# Patient Record
Sex: Female | Born: 1935 | Race: White | Hispanic: No | Marital: Married | Smoking: Never smoker
Health system: Southern US, Community
[De-identification: ages and names within clinical notes are randomized; demographics above are authoritative.]

## PROBLEM LIST (undated history)

## (undated) DIAGNOSIS — C801 Malignant (primary) neoplasm, unspecified: Secondary | ICD-10-CM

## (undated) DIAGNOSIS — D132 Benign neoplasm of duodenum: Secondary | ICD-10-CM

## (undated) DIAGNOSIS — K469 Unspecified abdominal hernia without obstruction or gangrene: Secondary | ICD-10-CM

## (undated) DIAGNOSIS — K635 Polyp of colon: Secondary | ICD-10-CM

## (undated) DIAGNOSIS — J984 Other disorders of lung: Secondary | ICD-10-CM

## (undated) DIAGNOSIS — D649 Anemia, unspecified: Secondary | ICD-10-CM

## (undated) HISTORY — PX: VEIN SURGERY: SHX48

## (undated) HISTORY — DX: Malignant (primary) neoplasm, unspecified: C80.1

## (undated) HISTORY — PX: OTHER SURGICAL HISTORY: SHX169

## (undated) HISTORY — DX: Benign neoplasm of duodenum: D13.2

## (undated) HISTORY — DX: Polyp of colon: K63.5

## (undated) HISTORY — PX: EXPLORATORY LAPAROTOMY: SUR591

## (undated) HISTORY — DX: Anemia, unspecified: D64.9

## (undated) HISTORY — DX: Unspecified abdominal hernia without obstruction or gangrene: K46.9

## (undated) HISTORY — PX: BASAL CELL CARCINOMA EXCISION: SHX1214

## (undated) HISTORY — DX: Other disorders of lung: J98.4

---
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
## 1973-08-04 HISTORY — PX: ABDOMINAL HYSTERECTOMY: SHX81

## 1998-08-04 HISTORY — PX: GALLBLADDER SURGERY: SHX652

## 2005-08-21 ENCOUNTER — Encounter: Admission: RE | Admit: 2005-08-21 | Discharge: 2005-08-21 | Payer: Self-pay | Admitting: Internal Medicine

## 2005-09-04 ENCOUNTER — Encounter: Admission: RE | Admit: 2005-09-04 | Discharge: 2005-09-04 | Payer: Self-pay | Admitting: Internal Medicine

## 2005-09-11 ENCOUNTER — Encounter: Admission: RE | Admit: 2005-09-11 | Discharge: 2005-09-11 | Payer: Self-pay | Admitting: Internal Medicine

## 2005-10-02 ENCOUNTER — Encounter: Admission: RE | Admit: 2005-10-02 | Discharge: 2005-10-02 | Payer: Self-pay | Admitting: Internal Medicine

## 2006-03-05 ENCOUNTER — Encounter: Admission: RE | Admit: 2006-03-05 | Discharge: 2006-03-05 | Payer: Self-pay | Admitting: Internal Medicine

## 2007-03-03 ENCOUNTER — Ambulatory Visit: Payer: Self-pay | Admitting: Pulmonary Disease

## 2007-03-03 ENCOUNTER — Encounter: Payer: Self-pay | Admitting: Critical Care Medicine

## 2007-03-03 LAB — CONVERTED CEMR LAB: Angiotensin 1 Converting Enzyme: 73 units/L — ABNORMAL HIGH (ref 9–67)

## 2007-03-17 ENCOUNTER — Ambulatory Visit: Payer: Self-pay | Admitting: Pulmonary Disease

## 2007-03-19 ENCOUNTER — Ambulatory Visit (HOSPITAL_COMMUNITY): Admission: RE | Admit: 2007-03-19 | Discharge: 2007-03-19 | Payer: Self-pay | Admitting: Gastroenterology

## 2007-03-19 ENCOUNTER — Encounter (INDEPENDENT_AMBULATORY_CARE_PROVIDER_SITE_OTHER): Payer: Self-pay | Admitting: Gastroenterology

## 2007-05-12 ENCOUNTER — Encounter (INDEPENDENT_AMBULATORY_CARE_PROVIDER_SITE_OTHER): Payer: Self-pay | Admitting: Gastroenterology

## 2007-05-12 ENCOUNTER — Ambulatory Visit (HOSPITAL_COMMUNITY): Admission: RE | Admit: 2007-05-12 | Discharge: 2007-05-12 | Payer: Self-pay | Admitting: Gastroenterology

## 2007-08-23 ENCOUNTER — Encounter: Payer: Self-pay | Admitting: Pulmonary Disease

## 2007-09-22 DIAGNOSIS — I8000 Phlebitis and thrombophlebitis of superficial vessels of unspecified lower extremity: Secondary | ICD-10-CM | POA: Insufficient documentation

## 2007-09-22 DIAGNOSIS — R0602 Shortness of breath: Secondary | ICD-10-CM | POA: Insufficient documentation

## 2007-09-22 DIAGNOSIS — D869 Sarcoidosis, unspecified: Secondary | ICD-10-CM

## 2007-09-22 DIAGNOSIS — J984 Other disorders of lung: Secondary | ICD-10-CM

## 2007-09-22 DIAGNOSIS — C4490 Unspecified malignant neoplasm of skin, unspecified: Secondary | ICD-10-CM

## 2007-09-22 DIAGNOSIS — R599 Enlarged lymph nodes, unspecified: Secondary | ICD-10-CM | POA: Insufficient documentation

## 2007-09-22 DIAGNOSIS — R071 Chest pain on breathing: Secondary | ICD-10-CM

## 2007-09-23 ENCOUNTER — Ambulatory Visit: Payer: Self-pay | Admitting: Pulmonary Disease

## 2010-03-04 ENCOUNTER — Encounter: Admission: RE | Admit: 2010-03-04 | Discharge: 2010-03-04 | Payer: Self-pay | Admitting: General Surgery

## 2010-04-18 ENCOUNTER — Inpatient Hospital Stay (HOSPITAL_COMMUNITY): Admission: RE | Admit: 2010-04-18 | Discharge: 2010-04-26 | Payer: Self-pay | Admitting: General Surgery

## 2010-04-18 ENCOUNTER — Encounter (INDEPENDENT_AMBULATORY_CARE_PROVIDER_SITE_OTHER): Payer: Self-pay | Admitting: General Surgery

## 2010-10-17 LAB — CBC
HCT: 31.7 % — ABNORMAL LOW (ref 36.0–46.0)
HCT: 37.2 % (ref 36.0–46.0)
HCT: 38.5 % (ref 36.0–46.0)
MCH: 31.2 pg (ref 26.0–34.0)
MCH: 31.3 pg (ref 26.0–34.0)
MCH: 31.5 pg (ref 26.0–34.0)
MCH: 30.6 pg (ref 26.0–34.0)
MCHC: 34.3 g/dL (ref 30.0–36.0)
MCHC: 34.8 g/dL (ref 30.0–36.0)
MCHC: 34.8 g/dL (ref 30.0–36.0)
MCHC: 34 g/dL (ref 30.0–36.0)
MCV: 90.8 fL (ref 78.0–100.0)
MCV: 89.9 fL (ref 78.0–100.0)
Platelets: 287 10*3/uL (ref 150–400)
Platelets: 316 10*3/uL (ref 150–400)
Platelets: 268 10*3/uL (ref 150–400)
RBC: 4.02 MIL/uL (ref 3.87–5.11)
RBC: 4.1 MIL/uL (ref 3.87–5.11)
RDW: 12.9 % (ref 11.5–15.5)
RDW: 13.2 % (ref 11.5–15.5)
RDW: 13.6 % (ref 11.5–15.5)
RDW: 13.3 % (ref 11.5–15.5)
WBC: 11.3 10*3/uL — ABNORMAL HIGH (ref 4.0–10.5)
WBC: 6.3 10*3/uL (ref 4.0–10.5)
WBC: 3.4 10*3/uL — ABNORMAL LOW (ref 4.0–10.5)

## 2010-10-17 LAB — COMPREHENSIVE METABOLIC PANEL
ALT: 31 U/L (ref 0–35)
ALT: 37 U/L — ABNORMAL HIGH (ref 0–35)
ALT: 39 U/L — ABNORMAL HIGH (ref 0–35)
ALT: 16 U/L (ref 0–35)
AST: 24 U/L (ref 0–37)
AST: 33 U/L (ref 0–37)
AST: 19 U/L (ref 0–37)
Albumin: 2.4 g/dL — ABNORMAL LOW (ref 3.5–5.2)
Albumin: 2.6 g/dL — ABNORMAL LOW (ref 3.5–5.2)
Alkaline Phosphatase: 125 U/L — ABNORMAL HIGH (ref 39–117)
Alkaline Phosphatase: 137 U/L — ABNORMAL HIGH (ref 39–117)
BUN: 10 mg/dL (ref 6–23)
BUN: 8 mg/dL (ref 6–23)
CO2: 27 mEq/L (ref 19–32)
Calcium: 8.3 mg/dL — ABNORMAL LOW (ref 8.4–10.5)
Calcium: 8.7 mg/dL (ref 8.4–10.5)
GFR calc Af Amer: >60 mL/min (ref >60)
GFR calc Af Amer: >60 mL/min (ref >60)
GFR calc Af Amer: >60 mL/min (ref >60)
GFR calc non Af Amer: >60 mL/min (ref >60)
Glucose, Bld: 124 mg/dL — ABNORMAL HIGH (ref 70–99)
Potassium: 3.9 mEq/L (ref 3.5–5.1)
Sodium: 134 mEq/L — ABNORMAL LOW (ref 135–145)
Sodium: 135 mEq/L (ref 135–145)
Total Bilirubin: 1.4 mg/dL — ABNORMAL HIGH (ref 0.3–1.2)
Total Bilirubin: 0.4 mg/dL (ref 0.3–1.2)
Total Protein: 5.8 g/dL — ABNORMAL LOW (ref 6.0–8.3)
Total Protein: 6 g/dL (ref 6.0–8.3)
Total Protein: 6.4 g/dL (ref 6.0–8.3)

## 2010-10-17 LAB — ABO/RH: ABO/RH(D): B POS

## 2010-10-17 LAB — URINE MICROSCOPIC-ADD ON

## 2010-10-17 LAB — BASIC METABOLIC PANEL
BUN: 10 mg/dL (ref 6–23)
BUN: 11 mg/dL (ref 6–23)
CO2: 27 mEq/L (ref 19–32)
CO2: 29 mEq/L (ref 19–32)
Calcium: 8.5 mg/dL (ref 8.4–10.5)
Chloride: 105 mEq/L (ref 96–112)
Creatinine, Ser: 0.64 mg/dL (ref 0.4–1.2)
Creatinine, Ser: 0.71 mg/dL (ref 0.4–1.2)
Creatinine, Ser: 0.79 mg/dL (ref 0.4–1.2)
GFR calc Af Amer: >60 mL/min (ref >60)
GFR calc Af Amer: >60 mL/min (ref >60)
GFR calc non Af Amer: >60 mL/min (ref >60)
GFR calc non Af Amer: >60 mL/min (ref >60)
Glucose, Bld: 131 mg/dL — ABNORMAL HIGH (ref 70–99)
Sodium: 137 mEq/L (ref 135–145)

## 2010-10-17 LAB — URINALYSIS, ROUTINE W REFLEX MICROSCOPIC
Glucose, UA: NEGATIVE mg/dL
Hgb urine dipstick: NEGATIVE
Protein, ur: NEGATIVE mg/dL

## 2010-10-17 LAB — DIFFERENTIAL
Basophils Absolute: 0 10*3/uL (ref 0.0–0.1)
Eosinophils Absolute: 0.2 10*3/uL (ref 0.0–0.7)
Eosinophils Relative: 7 % — ABNORMAL HIGH (ref 0–5)
Lymphs Abs: 0.9 10*3/uL (ref 0.7–4.0)

## 2010-10-17 LAB — PROTIME-INR: Prothrombin Time: 13.5 seconds (ref 11.6–15.2)

## 2010-10-17 LAB — SURGICAL PCR SCREEN: Staphylococcus aureus: NEGATIVE

## 2010-10-17 LAB — CROSSMATCH
ABO/RH(D): B POS
Antibody Screen: NEGATIVE

## 2010-12-17 NOTE — Op Note (Signed)
Shelby Mendez, Shelby Mendez               ACCOUNT NO.:  [id number]   MEDICAL RECORD NO.:  [id number]          PATIENT TYPE:  AMB   LOCATION:  ENDO                         FACILITY:  [facility]   PHYSICIAN:  Anselmo Rod, M.D.  DATE OF BIRTH:  1931/11/23   DATE OF PROCEDURE:  03/19/2007  DATE OF DISCHARGE:                               OPERATIVE REPORT   PROCEDURE PERFORMED:  Esophagogastroduodenoscopy with multiple cold  biopsies.   ENDOSCOPIST:  Anselmo Rod, M.D.   INSTRUMENT USED:  Pentax video panendoscope.   INDICATIONS FOR PROCEDURE:  This is a 77 year old white female with a  history of nausea, abdominal pain, reflux and change in bowel habits  undergoing EGD to rule out peptic ulcer disease, esophagitis, gastritis,  etc.  Small bowel biopsies are planned.   PREPROCEDURE PREPARATION:  Informed consent was procured from the  patient.  The patient fasted for eight hours prior to the procedure.   PHYSICAL EXAMINATION:  VITAL SIGNS:  The patient has stable vital signs.  NECK:  The neck is supple.  CHEST:  The chest clear to auscultation.  HEART:  The heart reveals S1 and S2,  regular.  ABDOMEN:  The abdomen is soft with normal bowel sounds.   DESCRIPTION OF THE PROCEDURE:  The patient was placed in left lateral  decubitus position, sedated with 50 mcg of Fentanyl 7 mg of Versed given  intravenously in slow incremental doses. Once the patient was adequately  sedated and maintained on low-flow oxygen and continuous cardiac  monitoring the Pentax video panendoscope was advanced through the  mouthpiece, over the tongue and into the esophagus under direct vision.  The entire esophagus was widely patent with no evidence of ring,  stricture, mass, esophagitis, or Barrett's mucosa. The scope was then  advanced into the stomach. A few small sessile gastric polyps were seen  in the high cardia.  Several of these were biopsied for pathology. The  rest of the gastric mucosa appeared  somewhat featureless.  No ulcers,  erosions, masses or polyps were identified.  Retroflexion in the high  cardia revealed no evidence of a hiatal hernia.  There was a prominent  fold versus a questionable mass in the postbulbar area that was biopsied  for pathology.  Isolated diverticulum was also seen in the first portion  of the duodenum. There was no outlet obstruction. The patient tolerated  the procedure well without immediate complications.   IMPRESSION:  1. Normal-appearing esophagus with no evidence of esophagitis or      Barrett's mucosa.  2. A few small sessile polyps in the high cardia; biopsied for      pathology.  3. No ulcers, erosions, masses or polyps were identified.  4. Prominent fold in the postbulbar area; multiple biopsies done for      pathology.  5. Isolated diverticulum in the first portion of the duodenum.   RECOMMENDATIONS:  1. Await pathology results.  2. Avoid all nonsteroidals including aspirin for the next 4 weeks  3. Continue PPI's.  4. Proceed with a colonoscopy at this time.  5. Further recommendations to be  made in follow-up.      Anselmo Rod, M.D.  Electronically Signed     JNM/MEDQ  D:  03/19/2007  T:  03/20/2007  Job:  [id number]

## 2010-12-17 NOTE — Op Note (Signed)
Shelby Mendez, BARI               ACCOUNT NO.:  [id number]   MEDICAL RECORD NO.:  [id number]          PATIENT TYPE:  AMB   LOCATION:  ENDO                         FACILITY:  [facility]   PHYSICIAN:  Anselmo Rod, M.D.  DATE OF BIRTH:  Dec 28, 1931   DATE OF PROCEDURE:  05/12/2007  DATE OF DISCHARGE:                               OPERATIVE REPORT   PROCEDURE PERFORMED:  Esophagogastroduodenoscopy with snare polypectomy  x 1.   ENDOSCOPIST:  Anselmo Rod, M.D.   INSTRUMENT USED:  Pentax video panendoscope.   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   PREPROCEDURE PREPARATION:  Informed consent was procured from the  patient. The patient fasted for 8 hours prior to the procedure and was  advised to stop  all nonsteroidals prior to the procedure.  Risks and  benefits of the procedure including bleeding, perforation requiring  surgery, etc., were discussed with the patient as well.   PREPROCEDURE PHYSICAL:  VITAL SIGNS:  The patient had stable vital  signs.  NECK:  Supple.  CHEST:  Clear to auscultation.  CARDIAC:  S1 and S2 regular.  ABDOMEN:  Soft with normal bowel sounds.   DESCRIPTION OF PROCEDURE:  The patient was placed in the left lateral  decubitus position and sedated with 75 mcg of Fentanyl and 8 kg of  Versed given intravenously in slow incremental doses. Once the patient  was adequately sedated and maintained on low-flow oxygen and continuous  cardiac monitoring, the Pentax video panendoscope was advanced through  the mouthpiece, over the tongue, into the esophagus under direct vision.  The entire esophagus was widely patent with no evidence of ring,  stricture, masses, esophagitis or Barrett's mucosa.  The scope was then  advanced into the stomach.  The entire gastric was appeared healthy.  Retroflexion in the high cardia revealed no  abnormalities.  A large  polypoid lesion was seen in the postbulbar area and was removed by snare  polypectomy after injecting 8 mL of epinephrine into the base to achieve  hemostasis. The patient tolerated the procedure well without  complications.   IMPRESSION:  1. Large polyp removed by hot snare from the postbulbar area after      injection of epinephrine into the base.  2. Otherwise normal-appearing esophagus and stomach.   RECOMMENDATIONS:  1. Await pathology results.  2. Avoid all nonsteroidals including aspirin for the next 4 weeks.  3. Outpatient follow-up in the next 2 weeks for further      recommendations.      Anselmo Rod, M.D.  Electronically Signed     JNM/MEDQ  D:  05/12/2007  T:  05/13/2007  Job:  [id number]   cc:   Fayrene Fearing __________, Judie Petit.D.

## 2010-12-17 NOTE — Letter (Signed)
March 03, 2007    Dr. Maryelizabeth Rowan  Dr. Farrell Ours  [facility]  [address]  [place], [place] [address]   RE:  Shelby Mendez, Shelby Mendez  MRN:  [id number]  /  DOB:  1932-04-27   Dear Drs. Dewey and Dr. Hart Rochester:   Thank you for this referral.  Shelby Mendez is a pleasant 77 year old  Caucasian woman who was referred for an evaluation of abnormal imaging  studies.  She developed dyspnea and right pleuritic chest pain for which  she received antibiotics.  Chest x-ray suggested a right upper lobe  nodule, and hence, a CT chest was performed without IV contrast.  This  showed multiple sub-centimeter nodules in both lung fields,  predominantly the upper lobes.  Mediastinal lymphadenopathy was also  noted, especially in the precarinalregion.  Some of these nodes were  calcified.  The left adrenal gland also showed a calcification.  An  enlarged node was also noted in between the esophagus and trachea  measuring 1.7 cm.  CT abdomen also showed mild enlarged nodes in the  region of the splenic hilum, and the region of the porta hepatis.   Fortunately, Shelby Mendez has brought her records from [place] with  her.  I do note that sarcoidosis was incidentally diagnosed in 2000 when  she underwent a cholecystectomy, and a lymph node was also biopsied.  Subsequently, PFTs showed preserved lung volumes with a TLC of 3.56  (76%), and a DLC of 79%.  FEV1 and FVC were 92% and 85% respectively.  An ACE level was 50.  She never required steroids since she remained  asymptomatic.   PAST MEDICAL HISTORY:  Includes:  1. Superficial phlebitis of the right lower extremity in 1975.  2. Basal cell carcinoma of the skin.   PAST SURGICAL HISTORY:  Includes:  1. Cholecystectomy in 2000.  2. Hysterectomy in 1975.  3. Appendectomy in 1975.  4. Vertebral fractures.  5. Tonsillectomy at age 72.   ALLERGIES:  1. SULFA.  2. PENICILLIN.   CURRENT MEDICATIONS:  Include:  1. Nexium 40 mg daily.  2. __________ 8 mg q.8 hours p.r.n.  3. Hydrocodone 5/500 mg q.6-8 hours p.r.n.   SOCIAL HISTORY:  Never smoker.  She is a social drinker.  She is  married, and lives with her husband.  She works in Airline pilot at [employer].  She lived in [place] and [place] prior to moving to  [place] about 6 years ago.   FAMILY HISTORY:  Noncontributory.   REVIEW OF SYSTEMS:  Her right-sided chest pain has significantly  improved.  She reports occasional indigestion and some abdominal pain.  She denies weight loss, loss of appetite, or fevers.   PHYSICAL EXAMINATION:  Weight 185 pounds.  Blood pressure 128/72.  Heart  rate 89.  Oxygen saturation 98% on room air.  HEENT:  Normal pharyngeal.  No postnasal drip.  NECK:  Supple.  No JVD.  No lymphadenopathy.  CARDIOVASCULAR:  S1 and S2 normal.  CHEST:  Clear to auscultation.  ABDOMEN:  Soft and nontender.  NEUROLOGIC:  Nonfocal.  EXTREMITIES:  No edema.   IMPRESSION:  1. Multiple pulmonary nodules.  2. Mediastinal and abdominal lymphadenopathy as described on the CT      scan.  3. Sarcoidosis.   The CT findings are certainly compatible with sarcoidosis.  Lymphoma  does exist in differential diagnosis, however, the calcification denotes  old granulomatous disease, and I would favor sarcoidosis.  She certainly  does  not seem to have any active symptoms that would suggest  tuberculosis or malignancy.   RECOMMENDATIONS:  1. An ACE level will be obtained.  Full PFTs will be obtained.  If      lung volumes have decreased or diffusion capacity has decreased, we      may consider treatment, otherwise, we will just observe her.  2. We will place a PPD to document.  3. Repeat imaging will be considered in 4 to 6 months, especially if      her symptoms are persistent to document stability of the      lymphadenopathy.    Sincerely,      Oretha Milch, MD  Electronically Signed    RVA/MedQ  DD: 03/03/2007  DT: 03/04/2007  Job #:  [id number]

## 2010-12-17 NOTE — Op Note (Signed)
NAMESAFIRA, PROFFIT               ACCOUNT NO.:  [id number]   MEDICAL RECORD NO.:  [id number]          PATIENT TYPE:  AMB   LOCATION:  ENDO                         [facility]ACILITY:  [facility]   PHYSICIAN:  Anselmo Rod, M.D.  DATE OF BIRTH:  Dec 07, 1931   DATE OF PROCEDURE:  03/19/2007  DATE OF DISCHARGE:                               OPERATIVE REPORT   PROCEDURE PERFORMED:  Colonoscopy with multiple cold biopsies.   ENDOSCOPIST:  Anselmo Rod, MD   INSTRUMENT USED:  Pentax video colonoscope.   INDICATIONS FOR PROCEDURE:  This is a 77 year old white female with a  history of abdominal pain and change in bowel habits, undergoing a  screening colonoscopy to rule out colonic polyps, masses, etc.   PREPROCEDURE PREPARATION:  Informed consent was procured from the  patient. The patient fasted for 8 hours prior to the procedure and  prepped with a bottle of magnesium citrate and a gallon of NuLytely the  night prior to the procedure.  Risks and benefits of the procedure  including a 10% miss rate of cancer and polyp were discussed with the  patient as well.   PREPROCEDURE PHYSICAL:  VITAL SIGNS:  The patient had stable vital  signs.  NECK:  Supple.  CHEST:  Clear to auscultation.  S1 and S2 regular.  ABDOMEN:  Soft with normal bowel sounds.   DESCRIPTION OF THE PROCEDURE:  The patient was placed in the left  lateral decubitus position and sedated with an additional 75 mcg of  Fentanyl and 3 mg of Versed given intravenously in slow incremental  doses. Once the patient was adequately sedated and maintained on low-  flow oxygen and continuous cardiac monitoring, the Pentax video  colonoscope was advanced from the rectum to the cecum.  The patient had  some residual stool in the right colon; multiple washes were done.  There was evidence of sigmoid diverticulosis with inspissated stool in  several of the diverticula. The appendiceal orifice and ileocecal valve  were visualized after  multiple washes.  The terminal ileum appeared  healthy without lesions.  Random colon biopsies were done to rule out  collagenous colitis.  Retroflexion in the rectum revealed mid small  internal hemorrhoids.   IMPRESSION:  1. Small nonbleeding internal hemorrhoids seen on retroflexion.  2. Sigmoid diverticulosis.  3. No masses or polyps identified.  4. Significant amount of residual stool in the colon, small lesions      could be missed.  5. Random colon biopsies done to rule out collagenous colitis.  6. Normal terminal ileum.   RECOMMENDATIONS:  1. Await pathology results.  2. Avoid all nonsteroidals for now.  3. Outpatient followup in the next 2 weeks for further      recommendations.      Anselmo Rod, M.D.  Electronically Signed     JNM/MEDQ  D:  03/19/2007  T:  03/20/2007  Job:  [id number]

## 2010-12-26 ENCOUNTER — Encounter (INDEPENDENT_AMBULATORY_CARE_PROVIDER_SITE_OTHER): Payer: Self-pay | Admitting: General Surgery

## 2011-02-21 ENCOUNTER — Ambulatory Visit (INDEPENDENT_AMBULATORY_CARE_PROVIDER_SITE_OTHER): Payer: Medicare Other | Admitting: General Surgery

## 2011-02-21 ENCOUNTER — Encounter (INDEPENDENT_AMBULATORY_CARE_PROVIDER_SITE_OTHER): Payer: Self-pay | Admitting: General Surgery

## 2011-02-21 DIAGNOSIS — D132 Benign neoplasm of duodenum: Secondary | ICD-10-CM | POA: Insufficient documentation

## 2011-02-21 DIAGNOSIS — D1330 Benign neoplasm of unspecified part of small intestine: Secondary | ICD-10-CM

## 2011-02-21 DIAGNOSIS — R109 Unspecified abdominal pain: Secondary | ICD-10-CM

## 2011-02-21 NOTE — Progress Notes (Signed)
Subjective:     Patient ID: Shelby Mendez, female   DOB: 06/24/1932, 77 y.o.   MRN: [id number]  HPI Patient is doing reasonably well status post exploratory laparotomy and resection of duodenal polyp. She has had 2 episodes of abdominal pain in the peri umbilical region. She says her abdomen got hard and she sat down and rested and placed a heating pad on it. This relieved the discomfort. This was not associated with nausea and vomiting. This was around a month ago. Her abdominal symptoms have improved since she has cut out greasy foods and is no longer eating dairy products. She had her repeat endoscopy by Dr. Loreta Ave in April and this was negative for adenoma.  Review of Systems  Constitutional: Negative.   HENT: Positive for congestion.   Eyes: Negative.   Respiratory: Negative.   Cardiovascular: Negative.   Gastrointestinal: Positive for abdominal pain.  Genitourinary: Negative.   Musculoskeletal: Negative.   Skin: Negative.   Neurological: Negative.   Hematological: Negative.   Psychiatric/Behavioral: Negative.        Objective:   Physical Exam  Constitutional: She is oriented to person, place, and time. She appears well-developed and well-nourished. No distress.  HENT:  Head: Normocephalic and atraumatic.  Mouth/Throat: No oropharyngeal exudate.  Eyes: Conjunctivae are normal. Pupils are equal, round, and reactive to light. No scleral icterus.  Neck: Normal range of motion. Neck supple. No tracheal deviation present. No thyromegaly present.  Cardiovascular: Normal rate, regular rhythm and intact distal pulses.   Pulmonary/Chest: Effort normal. No respiratory distress. She exhibits no tenderness.  Abdominal: Soft. Bowel sounds are normal. She exhibits no mass. There is tenderness (lower portion of incision). There is no rebound and no guarding.  Musculoskeletal: Normal range of motion. She exhibits no tenderness.  Lymphadenopathy:    She has no cervical adenopathy.  Neurological:  She is alert and oriented to person, place, and time. Coordination normal.  Skin: Skin is warm and dry. No rash noted. She is not diaphoretic. No erythema. No pallor.  Psychiatric: She has a normal mood and affect. Her behavior is normal. Judgment and thought content normal.       Assessment:     1.  Abdominal pain 2.  Duodenal adenoma      Plan:     1.  CT scan - suspect hernia 2.  Follow up in 3 months  Endoscopy in march 2013

## 2011-02-24 ENCOUNTER — Ambulatory Visit
Admission: RE | Admit: 2011-02-24 | Discharge: 2011-02-24 | Disposition: A | Discharge status: Home / Self Care | Payer: Medicare Other | Source: Ambulatory Visit | Attending: General Surgery | Admitting: General Surgery

## 2011-02-24 MED ORDER — IOHEXOL 300 MG/ML  SOLN
100.0000 mL | Freq: Once | INTRAMUSCULAR | Status: AC | PRN
  Administered 2011-02-24: 100 mL via INTRAVENOUS

## 2011-02-26 ENCOUNTER — Telehealth (INDEPENDENT_AMBULATORY_CARE_PROVIDER_SITE_OTHER): Payer: Self-pay | Admitting: General Surgery

## 2011-02-26 NOTE — Telephone Encounter (Signed)
PT WOULD LIKE CALL BACK WITH BX FINDINGS

## 2011-03-31 ENCOUNTER — Encounter (INDEPENDENT_AMBULATORY_CARE_PROVIDER_SITE_OTHER): Payer: Self-pay | Admitting: General Surgery

## 2011-03-31 ENCOUNTER — Ambulatory Visit (INDEPENDENT_AMBULATORY_CARE_PROVIDER_SITE_OTHER): Payer: Medicare Other | Admitting: General Surgery

## 2011-03-31 VITALS — BP 136/78 | HR 76

## 2011-03-31 DIAGNOSIS — K432 Incisional hernia without obstruction or gangrene: Secondary | ICD-10-CM | POA: Insufficient documentation

## 2011-03-31 NOTE — Assessment & Plan Note (Signed)
Plan elective laparoscopic repair. Reviewed risks and benefits of surgery. Pt desires elective repair. Discussed post op course.

## 2011-03-31 NOTE — Progress Notes (Signed)
HISTORY: Pt is status post duodenal resection for an adenoma. She has developed some abdominal pain over the past few months. Since I saw her last 6 weeks ago she's only had 3 episodes of pain but has diffuse bulge in her lower abdomen. A partial hysterectomy in the lower midline and my surgery to the upper midline. There are multiple defects along the entire incisional line. She is not having problems with nausea vomiting or constipation. She doesn't like the way her clothes are fitting. She has not had fevers or chills.  PERTINENT REVIEW OF SYSTEMS: Otherwise negative times 12 systems.  EXAM: Head: Normocephalic and atraumatic.  Eyes:  Conjunctivae are normal. Pupils are equal, round, and reactive to light. No scleral icterus.  Neck:  Normal range of motion. Neck supple.  Resp: No respiratory distress, normal effort. Abd: Soft. Bowel sounds are normal. Abdomen is soft, non distended and non tender No masses are palpable.  There is no rebound and no guarding.  Neurological: Alert and oriented to person, place, and time. Coordination normal.  Skin: Skin is warm and dry. No rash noted. No diaphoretic. No erythema. No pallor.  Psychiatric: Normal mood and affect. Normal behavior. Judgment and thought content normal.     CT: CT abd/pelvis reveals multiple hernia defects.   Incisional hernia, upper and lower abdomen Plan elective laparoscopic repair. Reviewed risks and benefits of surgery. Pt desires elective repair. Discussed post op course.         Maudry Diego, MD Surgical Oncology, General & Endocrine Surgery [facility]  Provider Not In System Almond Lint, MD

## 2011-04-10 ENCOUNTER — Encounter (HOSPITAL_COMMUNITY)
Admission: RE | Admit: 2011-04-10 | Discharge: 2011-04-10 | Disposition: A | Discharge status: Home / Self Care | Payer: Medicare Other | Source: Ambulatory Visit | Attending: General Surgery | Admitting: General Surgery

## 2011-04-10 LAB — CBC
MCH: 30.4 pg (ref 26.0–34.0)
MCHC: 34.8 g/dL (ref 30.0–36.0)
Platelets: 261 10*3/uL (ref 150–400)
RBC: 4.25 MIL/uL (ref 3.87–5.11)
RDW: 13.3 % (ref 11.5–15.5)

## 2011-04-10 LAB — COMPREHENSIVE METABOLIC PANEL
ALT: 15 U/L (ref 0–35)
Alkaline Phosphatase: 64 U/L (ref 39–117)
BUN: 14 mg/dL (ref 6–23)
Chloride: 106 mEq/L (ref 96–112)
GFR calc Af Amer: >60 mL/min (ref >60)
Glucose, Bld: 99 mg/dL (ref 70–99)
Potassium: 4.4 mEq/L (ref 3.5–5.1)
Sodium: 140 mEq/L (ref 135–145)
Total Bilirubin: 0.4 mg/dL (ref 0.3–1.2)

## 2011-04-10 LAB — URINALYSIS, ROUTINE W REFLEX MICROSCOPIC
Bilirubin Urine: NEGATIVE
Glucose, UA: NEGATIVE mg/dL
Hgb urine dipstick: NEGATIVE
Ketones, ur: NEGATIVE mg/dL
Protein, ur: NEGATIVE mg/dL

## 2011-04-10 LAB — SURGICAL PCR SCREEN: MRSA, PCR: NEGATIVE

## 2011-04-10 LAB — PROTIME-INR: Prothrombin Time: 13.3 seconds (ref 11.6–15.2)

## 2011-04-10 NOTE — Progress Notes (Signed)
Quick Note:  Pt needs 1 week cipro 500 bid x 1 week and reck ua Also needs u cx  tx  fb ______

## 2011-04-11 ENCOUNTER — Telehealth (INDEPENDENT_AMBULATORY_CARE_PROVIDER_SITE_OTHER): Payer: Self-pay | Admitting: General Surgery

## 2011-04-11 DIAGNOSIS — N390 Urinary tract infection, site not specified: Secondary | ICD-10-CM

## 2011-04-11 MED ORDER — CIPROFLOXACIN HCL 500 MG PO TABS: 500.0000 mg | ORAL_TABLET | Freq: Two times a day (BID) | ORAL | Status: AC

## 2011-04-11 NOTE — Telephone Encounter (Signed)
Message copied by Latricia Heft on Fri Apr 11, 2011  1:38 PM ------      Message from: Shelby Mendez      Created: Thu Apr 10, 2011  9:20 PM       Pt needs 1 week cipro 500 bid x 1 week and reck ua  Also needs u cx        tx            fb

## 2011-04-21 ENCOUNTER — Inpatient Hospital Stay (HOSPITAL_COMMUNITY)
Admission: RE | Admit: 2011-04-21 | Discharge: 2011-04-24 | DRG: 355 | Disposition: A | Discharge status: Home / Self Care | Payer: Medicare Other | Source: Ambulatory Visit | Attending: General Surgery | Admitting: General Surgery

## 2011-04-21 ENCOUNTER — Ambulatory Visit (HOSPITAL_COMMUNITY)
Admission: RE | Admit: 2011-04-21 | Discharge: 2011-04-21 | Disposition: A | Discharge status: Home / Self Care | Payer: Medicare Other | Source: Ambulatory Visit | Attending: General Surgery | Admitting: General Surgery

## 2011-04-21 ENCOUNTER — Other Ambulatory Visit (INDEPENDENT_AMBULATORY_CARE_PROVIDER_SITE_OTHER): Payer: Self-pay | Admitting: General Surgery

## 2011-04-21 DIAGNOSIS — J8410 Pulmonary fibrosis, unspecified: Secondary | ICD-10-CM | POA: Diagnosis present

## 2011-04-21 DIAGNOSIS — Z7982 Long term (current) use of aspirin: Secondary | ICD-10-CM

## 2011-04-21 DIAGNOSIS — Z882 Allergy status to sulfonamides status: Secondary | ICD-10-CM

## 2011-04-21 DIAGNOSIS — Z79899 Other long term (current) drug therapy: Secondary | ICD-10-CM

## 2011-04-21 DIAGNOSIS — K439 Ventral hernia without obstruction or gangrene: Secondary | ICD-10-CM

## 2011-04-21 DIAGNOSIS — Z880 Allergy status to penicillin: Secondary | ICD-10-CM

## 2011-04-21 DIAGNOSIS — Z01818 Encounter for other preprocedural examination: Secondary | ICD-10-CM

## 2011-04-21 HISTORY — PX: HERNIA REPAIR: SHX51

## 2011-04-22 LAB — BASIC METABOLIC PANEL
BUN: 9 mg/dL (ref 6–23)
Calcium: 8.4 mg/dL (ref 8.4–10.5)
Creatinine, Ser: 0.69 mg/dL (ref 0.50–1.10)
GFR calc Af Amer: >60 mL/min (ref >60)
GFR calc non Af Amer: >60 mL/min (ref >60)
Glucose, Bld: 113 mg/dL — ABNORMAL HIGH (ref 70–99)

## 2011-04-22 LAB — CBC
HCT: 33.2 % — ABNORMAL LOW (ref 36.0–46.0)
Hemoglobin: 11.3 g/dL — ABNORMAL LOW (ref 12.0–15.0)
MCH: 30 pg (ref 26.0–34.0)
MCHC: 34 g/dL (ref 30.0–36.0)
MCV: 88.1 fL (ref 78.0–100.0)
RDW: 13.4 % (ref 11.5–15.5)

## 2011-04-24 LAB — URINALYSIS, ROUTINE W REFLEX MICROSCOPIC
Glucose, UA: NEGATIVE mg/dL
Ketones, ur: NEGATIVE mg/dL
Leukocytes, UA: NEGATIVE
Protein, ur: NEGATIVE mg/dL
pH: 7.5 (ref 5.0–8.0)

## 2011-04-24 LAB — URINE MICROSCOPIC-ADD ON

## 2011-04-25 LAB — URINE CULTURE: Culture  Setup Time: 201209201649

## 2011-05-01 NOTE — Discharge Summary (Signed)
  NAMERIKI, BERNINGER NO.:  [id number]  MEDICAL RECORD NO.:  [id number]  LOCATION:  5114                         FACILITY:  [facility]  PHYSICIAN:  Almond Lint, MD       DATE OF BIRTH:  April 26, 1932  DATE OF ADMISSION:  04/21/2011 DATE OF DISCHARGE:  04/24/2011                              DISCHARGE SUMMARY   PRINCIPAL DIAGNOSIS:  Ventral hernia.  SECONDARY DIAGNOSIS:  History of duodenal adenoma.  PRINCIPAL PROCEDURE:  Laparoscopic ventral hernia repair with mesh on April 21, 2011.  DISCHARGE INSTRUCTIONS:  Abdominal binder at all times for a week and a half and then abdominal binder when out of bed, after that no lifting over 15 pounds for 6 weeks.  May shower, but no bathing or swimming for 2 weeks.  Diet as tolerated.  HOSPITAL COURSE:  Ms. Shelby Mendez was admitted to the hospital following her laparoscopic ventral hernia repair.  Her Foley catheter was removed on postop day 1.  She was able to void spontaneously.  Prior to discharge, she had another episode of hematuria which she had, had before.  She ambulated without difficulty and tolerated a regular diet. She has had some gas.  She is able to toilet herself.  She is going to follow up with me in 3 to 4 weeks.  She is discharged to home in improved condition.     Almond Lint, MD     FB/MEDQ  D:  04/24/2011  T:  04/24/2011  Job:  [id number]  Electronically Signed by Almond Lint MD on 05/01/2011 09:18:53 PM

## 2011-05-05 ENCOUNTER — Telehealth (INDEPENDENT_AMBULATORY_CARE_PROVIDER_SITE_OTHER): Payer: Self-pay | Admitting: General Surgery

## 2011-05-05 DIAGNOSIS — Z8719 Personal history of other diseases of the digestive system: Secondary | ICD-10-CM

## 2011-05-05 MED ORDER — HYDROCODONE-ACETAMINOPHEN 5-325 MG PO TABS: 1.0000 | ORAL_TABLET | Freq: Four times a day (QID) | ORAL | Status: AC | PRN

## 2011-05-13 ENCOUNTER — Ambulatory Visit (INDEPENDENT_AMBULATORY_CARE_PROVIDER_SITE_OTHER): Payer: Medicare Other | Admitting: General Surgery

## 2011-05-13 ENCOUNTER — Encounter (INDEPENDENT_AMBULATORY_CARE_PROVIDER_SITE_OTHER): Payer: Self-pay | Admitting: General Surgery

## 2011-05-13 VITALS — BP 122/76 | HR 80 | Temp 97.2°F | Resp 20 | Ht 61.0 in | Wt 146.4 lb

## 2011-05-13 DIAGNOSIS — K432 Incisional hernia without obstruction or gangrene: Secondary | ICD-10-CM

## 2011-05-13 NOTE — Progress Notes (Signed)
HISTORY: Pt is three weeks status post laparoscopic ventral hernia repair.  She had significant pain up front after surgery, but this is improving.  She is still tired, but energy level is better.  She is not having fever/chills.  She had an episode of constipation.     PERTINENT REVIEW OF SYSTEMS: Otherwise negative.  EXAM: Head: Normocephalic and atraumatic.  Eyes:  Conjunctivae are normal. Pupils are equal, round, and reactive to light. No scleral icterus.  Neck:  Normal range of motion. Neck supple. No tracheal deviation present. No thyromegaly present.  Resp: No respiratory distress, normal effort. Abd: Soft. Abdomen is soft, non distended and non tender No masses are palpable.  There is no rebound and no guarding. Incisions are healing well.   Neurological: Alert and oriented to person, place, and time. Coordination normal.  Skin: Skin is warm and dry. No rash noted. No diaphoretic. No erythema. No pallor.  Psychiatric: Normal mood and affect. Normal behavior. Judgment and thought content normal.     ASSESSMENT AND PLAN:   Incisional hernia, upper and lower abdomen Pt doing well after hernia repair.   May gently liberalize activity. Continue lifting restrictions for another 3 weeks. Follow up as needed.         Maudry Diego, MD Surgical Oncology, General & Endocrine Surgery [facility]  Provider Not In System No ref. provider found

## 2011-05-13 NOTE — Assessment & Plan Note (Signed)
Pt doing well after hernia repair.   May gently liberalize activity. Continue lifting restrictions for another 3 weeks. Follow up as needed.

## 2011-05-13 NOTE — Patient Instructions (Signed)
May leave off binder at night or while resting. May liberalize lifting to 20 pounds.  No bowling for 3 more weeks.

## 2011-05-20 ENCOUNTER — Telehealth (INDEPENDENT_AMBULATORY_CARE_PROVIDER_SITE_OTHER): Payer: Self-pay

## 2011-05-20 NOTE — Telephone Encounter (Signed)
LMOM for pt to call our office to schedule f/u appt with DR Donell Beers.Hulda Humphrey

## 2011-05-20 NOTE — Op Note (Signed)
Shelby Mendez, Shelby Mendez NO.:  [id number]  MEDICAL RECORD NO.:  [id number]  LOCATION:  XRAY                         FACILITY:  [facility]  PHYSICIAN:  Almond Lint, MD       DATE OF BIRTH:  07/08/1932  DATE OF PROCEDURE:  04/21/2011 DATE OF DISCHARGE:                              OPERATIVE REPORT   PREOPERATIVE DIAGNOSIS:  Ventral hernia.  POSTOPERATIVE DIAGNOSIS:  Ventral hernia.  PROCEDURE PERFORMED:  Laparoscopic ventral hernia repair with mesh.  SURGEON:  Almond Lint, MD  ASSISTANT:  Wilmon Arms. Corliss Skains, MD  ANESTHESIA:  General and local.  FINDINGS:  Ventral hernia defect.  SPECIMEN:  None.  ESTIMATED BLOOD LOSS:  Minimal.  COMPLICATIONS:  None known.  DESCRIPTION OF PROCEDURE:  Ms. Zafar was identified in the holding area and taken to the operating room where she was placed supine on the operating room table.  General anesthesia was induced.  The left arm was tucked.  Her abdomen was prepped and draped in sterile fashion.  Time- out was performed according to the surgical safety check list.  When all was correct, we continued.  The left upper quadrant at the costal margin was anesthetized and a 5-mm trocar was placed at the costal margin. This was an OptiView and placed under direct visualization, watching as the trocar went to the muscle layers.  Pneumoperitoneum was achieved to a pressure of 15 mmHg.  The trocar was under the omentum and so this was pulled more laterally in order to see the remainder of the abdomen.  Two other 5-mm trocars were placed on the left side of the abdomen as far lateral as possible.  The atraumatic grasper was then used to grasp the abdominal contents and the cold scissors were used to take down the bowel and the omentum from the abdominal wall.  The hernia was then completely reduced.  The liver edge had to be taken down as well as the left lateral segment.    Once the hernia defect was completely done, it was  measured.  The external measurements were 13 x 20.  A 20 x 25 piece of mesh was selected.  Eight #1 PDS sutures were placed in clockwise fashion equidistantly.  The left upper quadrant costal margin trocar was upsized to an 11-mm trocar.  The mesh was rolled up and placed through the site.  The sutures that were previously tied were then pulled through the abdominal wall with the PMI suture passer.  The first side was tied down first and then the near side.  The secure strap absorbable tacker was then used to tack the mesh into the abdominal wall circumferentially.    A 4-quadrant inspection was then performed to make sure that there was no pooling of blood in any other quadrants and no succus seen.  There was none.  The mesh was tight and not puckered anywhere.  The left upper quadrant trocar was able to be closed underneath the mesh.  The two other trocars were then visualized coming out of the abdominal wall with no evidence of bleeding. Pneumoperitoneum was allowed to evacuate.  The skin of all the incisions was closed with a 4-0  Monocryl in subcuticular fashion.  The wounds werethen cleaned, dried, and dressed with Dermabond.  The poke holes for the sutures were also closed with Dermabond.  The abdomen was then dressed with soft dressing and the abdominal binder.  The patient tolerated the procedure well.  She was awakened from anesthesia and taken to the PACU in stable condition.  Needle, sponge, and instrument counts were correct.     Almond Lint, MD     FB/MEDQ  D:  04/21/2011  T:  04/21/2011  Job:  [id number]  Electronically Signed by Almond Lint MD on 05/20/2011 07:54:02 AM

## 2011-10-01 NOTE — Telephone Encounter (Signed)
error 

## 2013-02-16 IMAGING — CT CT ABD-PELV W/ CM
1 of 5 series · 13 of 36 positions shown, 18 images · IV contrast (READICAT/WATER & [ID] OMNI 300)
Comparison: None

CLINICAL DATA: Abdominal pain

CT ABDOMEN AND PELVIS WITH CONTRAST
TECHNIQUE: Multidetector CT imaging of the abdomen and pelvis was
performed following the standard protocol during bolus
administration of intravenous contrast.
BUN and creatinine were obtained on site at [HOSPITAL] at
[HOSPITAL].
Results:  BUN 11 mg/dL,  Creatinine 0.8 mg/dL.
Contrast: 100 ml of omni 300

[Series 2: abdomen w/ · axial · 0.72mm/px · z∈[-401,-51]mm · 13 of 80 slices shown, 18 images]
[im 5/80  soft-tissue]
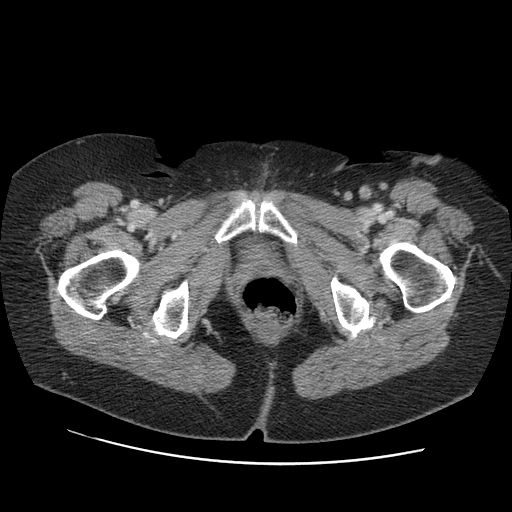
[im 5/80  bone]
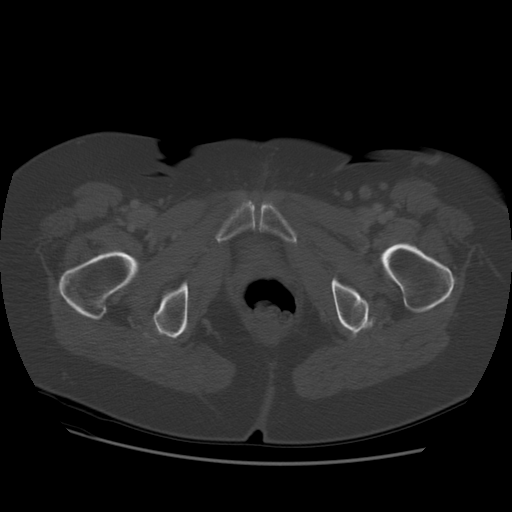
[im 14/80  soft-tissue]
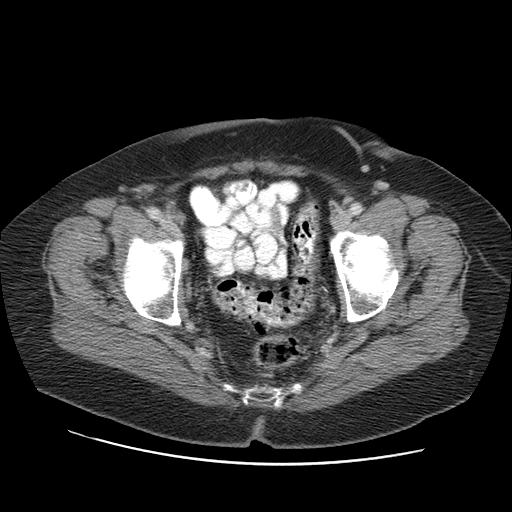
[im 18/80  soft-tissue]
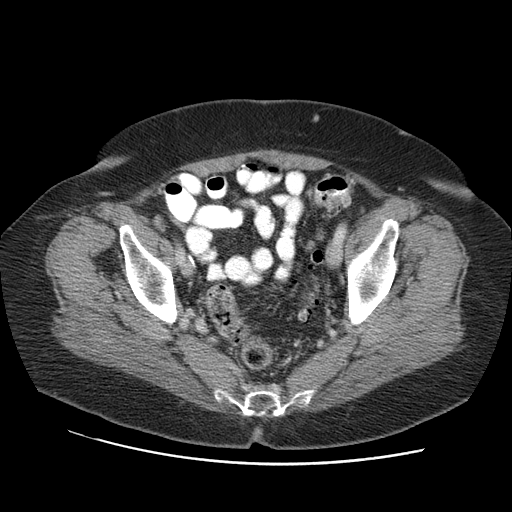
[im 22/80  soft-tissue]
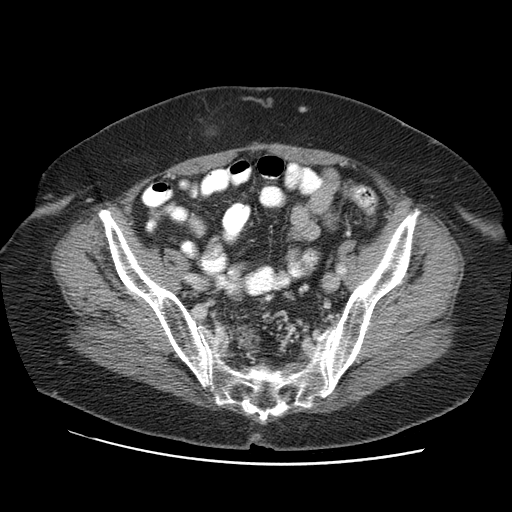
[im 31/80  soft-tissue]
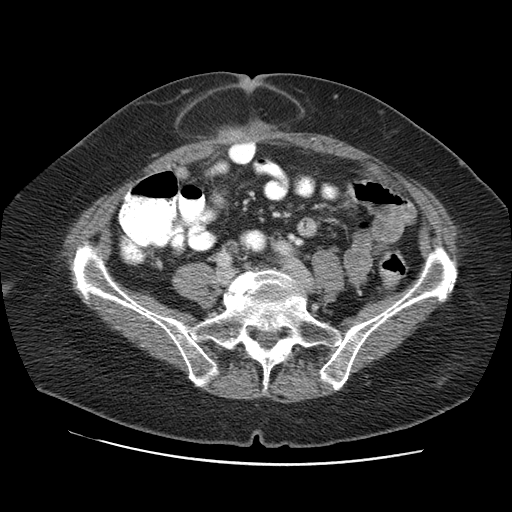
[im 36/80  soft-tissue]
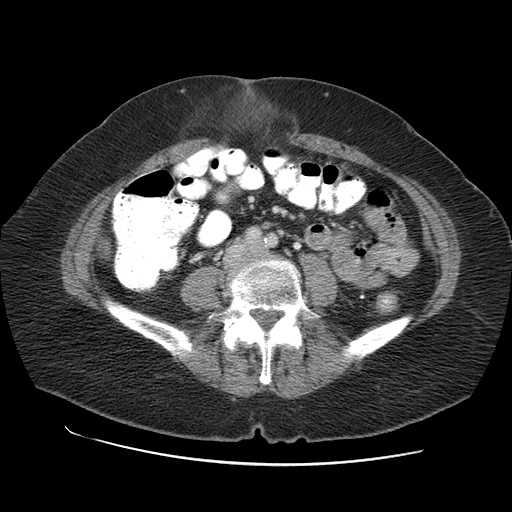
[im 44/80  soft-tissue]
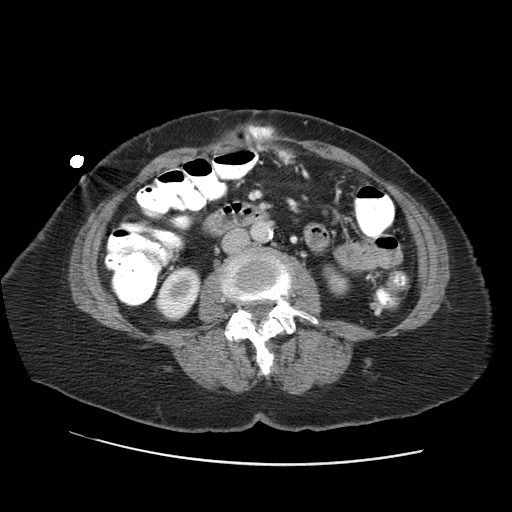
[im 49/80  soft-tissue]
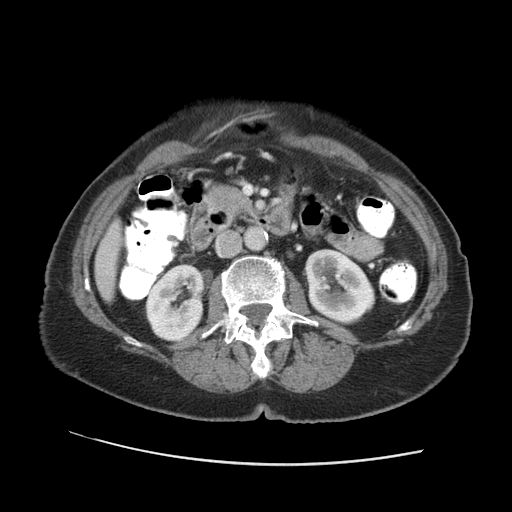
[im 58/80  soft-tissue]
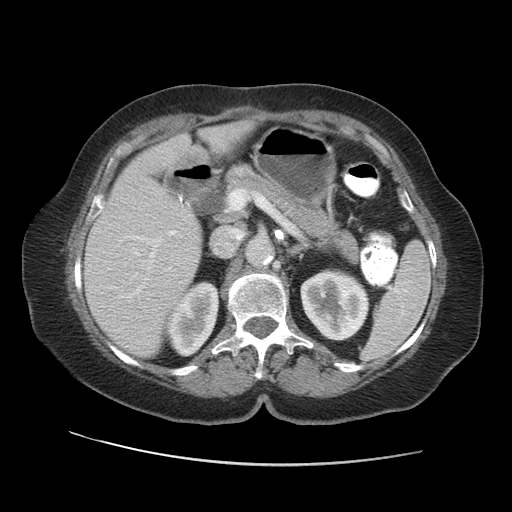
[im 58/80  bone]
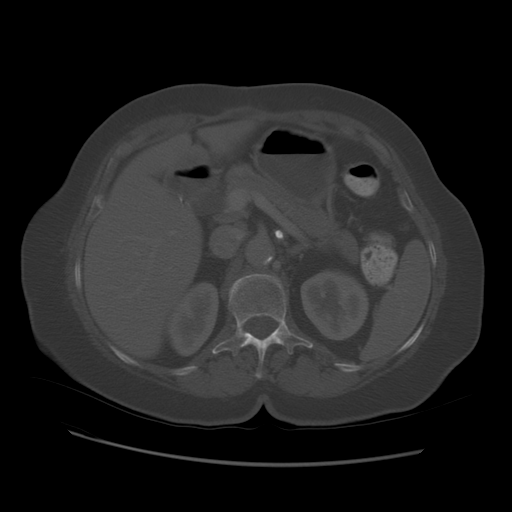
[im 62/80  soft-tissue]
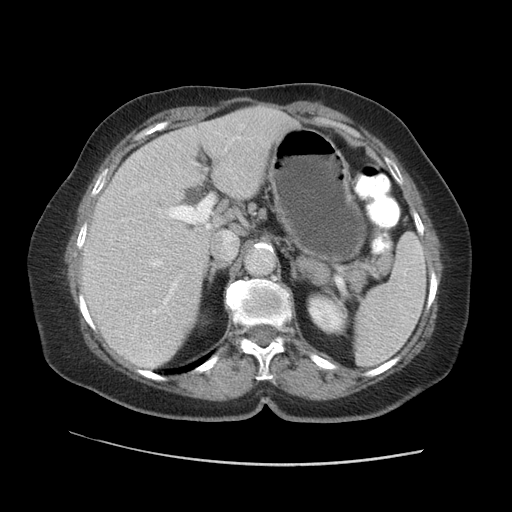
[im 62/80  lung]
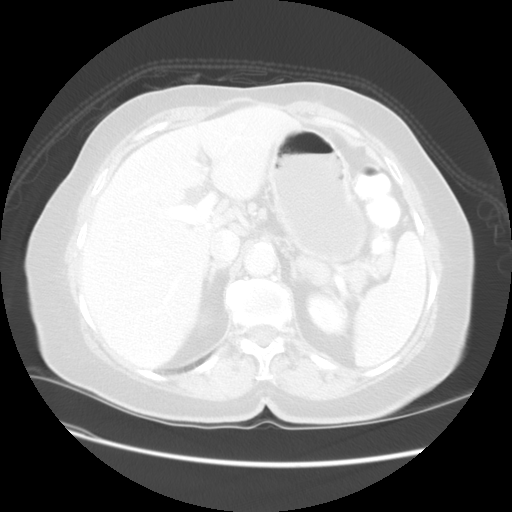
[im 66/80  soft-tissue]
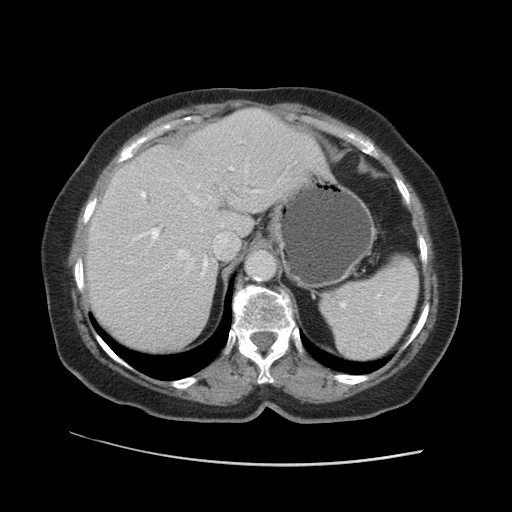
[im 66/80  lung]
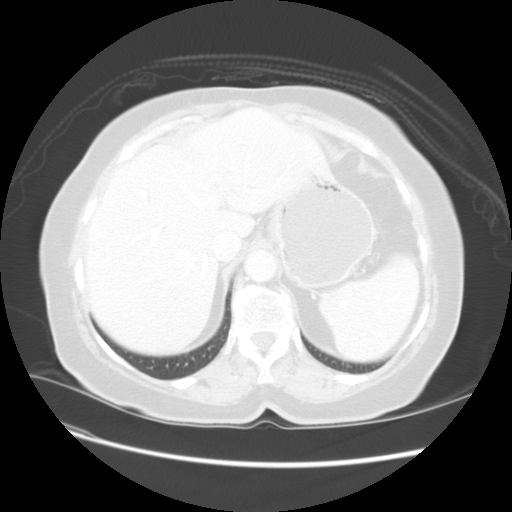
[im 71/80  lung]
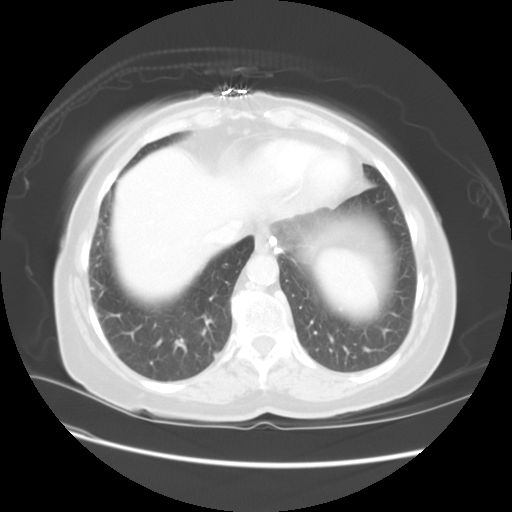
[im 75/80  soft-tissue]
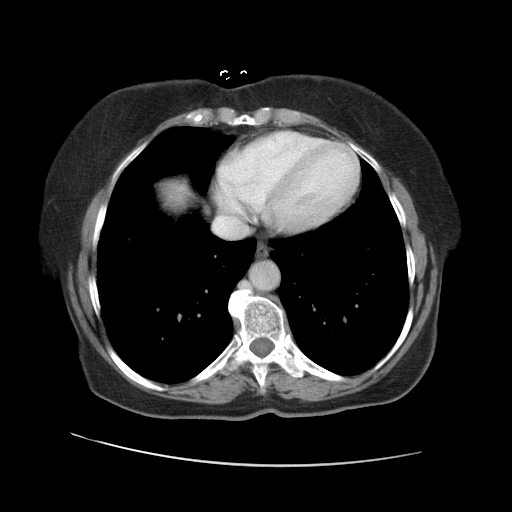
[im 75/80  lung]
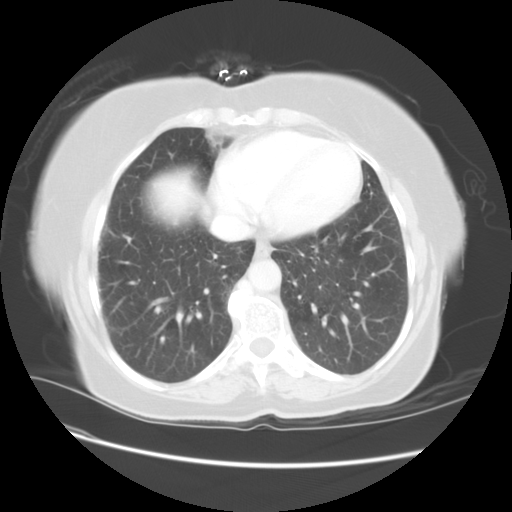

[13 of 36 positions shown; findings below may reference images not displayed]

FINDINGS: The lung bases appear clear.

There is no pericardial or pleural effusion.

The spleen is normal.  There is no focal liver abnormality.  Status
post cholecystectomy.  The common bile duct measures up to 1.3 cm,
image 24.  No obstructing stone or mass.

The pancreas appears normal.

Both of the adrenal glands are normal.

Normal appearance of the kidneys.

There are enlarged upper abdominal lymph nodes.  For example,
within the splenic hilum there is a lymph node measuring 1.4 cm.
Along the dorsal surface of the greater curvature of the stomach
there is a 1.5 cm lymph node, image 19.  Gastrohepatic ligament
lymph node measures 0.9 cm.

No enlarged pelvic or inguinal adenopathy.

Urinary bladder appears normal.

There are multiple large ventral abdominal wall hernias.  The most
cranial hernia is situated 10 cm cranial to the umbilicus and
contains the ventral wall of the stomach.  2.6 cm more caudal is a
supraumbilical hernia which contains fat and a nonobstructed loop
of transverse colon.  At the level of the umbilicus there is large
hernia measuring 9.4 x 3.5 cm.  This contains a fat only. Just
immediately below this is a smaller hernia which contains
nonobstructed loops of small bowel.

Review of the visualized osseous structures is significant for
multilevel spondylosis.

No worrisome bone abnormalities identified.
IMPRESSION: 1.  There are at least four hernias involving the ventral abdominal
wall which contain the stomach, transverse colon, fat and
nonobstructed loop of small bowel.
2.  Enlarged upper abdominal lymph nodes.  Findings are
nonspecific.  Cannot rule out metastatic adenopathy or lymphoma.
Granulomatous inflammation or infection may have a similar
appearance.  Consider PET-CT for further evaluation.

## 2013-03-27 ENCOUNTER — Inpatient Hospital Stay (HOSPITAL_COMMUNITY)
Admission: EM | Admit: 2013-03-27 | Discharge: 2013-03-29 | DRG: 641 | Disposition: A | Discharge status: Home / Self Care | Payer: Medicare Other | Attending: Internal Medicine | Admitting: Internal Medicine

## 2013-03-27 ENCOUNTER — Encounter (HOSPITAL_COMMUNITY): Payer: Self-pay | Admitting: Cardiology

## 2013-03-27 ENCOUNTER — Emergency Department (HOSPITAL_COMMUNITY): Payer: Medicare Other

## 2013-03-27 DIAGNOSIS — E872 Acidosis, unspecified: Secondary | ICD-10-CM | POA: Diagnosis present

## 2013-03-27 DIAGNOSIS — I824Y9 Acute embolism and thrombosis of unspecified deep veins of unspecified proximal lower extremity: Secondary | ICD-10-CM | POA: Diagnosis present

## 2013-03-27 DIAGNOSIS — R197 Diarrhea, unspecified: Secondary | ICD-10-CM | POA: Diagnosis present

## 2013-03-27 DIAGNOSIS — R0602 Shortness of breath: Secondary | ICD-10-CM

## 2013-03-27 DIAGNOSIS — D684 Acquired coagulation factor deficiency: Secondary | ICD-10-CM | POA: Diagnosis present

## 2013-03-27 DIAGNOSIS — E860 Dehydration: Principal | ICD-10-CM | POA: Diagnosis present

## 2013-03-27 DIAGNOSIS — E876 Hypokalemia: Secondary | ICD-10-CM | POA: Diagnosis present

## 2013-03-27 LAB — URINALYSIS, ROUTINE W REFLEX MICROSCOPIC
Hgb urine dipstick: NEGATIVE
Leukocytes, UA: NEGATIVE
Nitrite: NEGATIVE
Protein, ur: NEGATIVE mg/dL
Specific Gravity, Urine: <1.005 — ABNORMAL LOW (ref 1.005–1.030)
Urobilinogen, UA: 0.2 mg/dL (ref 0.0–1.0)

## 2013-03-27 LAB — CBC WITH DIFFERENTIAL/PLATELET
Eosinophils Relative: 0 % (ref 0–5)
HCT: 47.3 % — ABNORMAL HIGH (ref 36.0–46.0)
Hemoglobin: 17.5 g/dL — ABNORMAL HIGH (ref 12.0–15.0)
Lymphocytes Relative: 22 % (ref 12–46)
MCHC: 37 g/dL — ABNORMAL HIGH (ref 30.0–36.0)
MCV: 83.1 fL (ref 78.0–100.0)
Monocytes Absolute: 0.7 10*3/uL (ref 0.1–1.0)
Monocytes Relative: 15 % — ABNORMAL HIGH (ref 3–12)
Neutro Abs: 3 10*3/uL (ref 1.7–7.7)
RDW: 13 % (ref 11.5–15.5)
WBC: 4.8 10*3/uL (ref 4.0–10.5)

## 2013-03-27 LAB — COMPREHENSIVE METABOLIC PANEL
BUN: 43 mg/dL — ABNORMAL HIGH (ref 6–23)
CO2: 16 mEq/L — ABNORMAL LOW (ref 19–32)
Calcium: 9.9 mg/dL (ref 8.4–10.5)
Chloride: 97 mEq/L (ref 96–112)
Creatinine, Ser: 0.96 mg/dL (ref 0.50–1.10)
GFR calc Af Amer: 64 mL/min — ABNORMAL LOW (ref >90)
GFR calc non Af Amer: 55 mL/min — ABNORMAL LOW (ref >90)
Glucose, Bld: 122 mg/dL — ABNORMAL HIGH (ref 70–99)
Total Bilirubin: 0.7 mg/dL (ref 0.3–1.2)

## 2013-03-27 LAB — LIPASE, BLOOD: Lipase: 42 U/L (ref 11–59)

## 2013-03-27 MED ORDER — ACETAMINOPHEN 650 MG RE SUPP: 650.0000 mg | Freq: Four times a day (QID) | RECTAL | Status: DC | PRN

## 2013-03-27 MED ORDER — ONDANSETRON HCL 4 MG/2ML IJ SOLN: 4.0000 mg | Freq: Once | INTRAMUSCULAR | Status: DC

## 2013-03-27 MED ORDER — ONDANSETRON 4 MG PO TBDP
8.0000 mg | ORAL_TABLET | Freq: Once | ORAL | Status: AC
  Administered 2013-03-27: 8 mg via ORAL
  Filled 2013-03-27: qty 2

## 2013-03-27 MED ORDER — POTASSIUM CHLORIDE CRYS ER 20 MEQ PO TBCR
40.0000 meq | EXTENDED_RELEASE_TABLET | Freq: Once | ORAL | Status: AC
  Administered 2013-03-27: 40 meq via ORAL
  Filled 2013-03-27: qty 2

## 2013-03-27 MED ORDER — ACETAMINOPHEN 325 MG PO TABS: 650.0000 mg | ORAL_TABLET | Freq: Four times a day (QID) | ORAL | Status: DC | PRN

## 2013-03-27 MED ORDER — POTASSIUM CHLORIDE 10 MEQ/100ML IV SOLN
10.0000 meq | Freq: Once | INTRAVENOUS | Status: AC
  Administered 2013-03-27: 10 meq via INTRAVENOUS
  Filled 2013-03-27: qty 100

## 2013-03-27 MED ORDER — ASPIRIN EC 81 MG PO TBEC
81.0000 mg | DELAYED_RELEASE_TABLET | Freq: Every day | ORAL | Status: DC
  Administered 2013-03-28 – 2013-03-29 (×2): 81 mg via ORAL
  Filled 2013-03-27 (×3): qty 1

## 2013-03-27 MED ORDER — SODIUM CHLORIDE 0.9 % IV BOLUS (SEPSIS)
1000.0000 mL | Freq: Once | INTRAVENOUS | Status: AC
  Administered 2013-03-27: 1000 mL via INTRAVENOUS

## 2013-03-27 MED ORDER — IOHEXOL 300 MG/ML  SOLN
100.0000 mL | Freq: Once | INTRAMUSCULAR | Status: AC | PRN
  Administered 2013-03-27: 100 mL via INTRAVENOUS

## 2013-03-27 MED ORDER — IOHEXOL 300 MG/ML  SOLN
25.0000 mL | INTRAMUSCULAR | Status: DC | PRN
  Administered 2013-03-27: 25 mL via ORAL

## 2013-03-27 MED ORDER — SODIUM CHLORIDE 0.9 % IV SOLN
INTRAVENOUS | Status: DC
  Administered 2013-03-27 – 2013-03-29 (×3): via INTRAVENOUS

## 2013-03-27 MED ORDER — ONDANSETRON HCL 4 MG PO TABS: 4.0000 mg | ORAL_TABLET | Freq: Four times a day (QID) | ORAL | Status: DC | PRN

## 2013-03-27 MED ORDER — ONDANSETRON HCL 4 MG/2ML IJ SOLN: 4.0000 mg | Freq: Four times a day (QID) | INTRAMUSCULAR | Status: DC | PRN

## 2013-03-27 NOTE — ED Notes (Signed)
Pt reports vomiting, diarrhea and mild abd pain that started on Wednesday. She states that it hit her like a "bolt of lightening". States that she has not been able to keep anything down. Reports that things also taste terrible. Denies any urinary symptoms.

## 2013-03-27 NOTE — ED Notes (Signed)
I gave the patient 2 warm blankets. 

## 2013-03-27 NOTE — ED Provider Notes (Signed)
CSN: [id number]     Arrival date & time 03/27/13  1255 History     First MD Initiated Contact with Patient 03/27/13 1337     Chief Complaint  Patient presents with  . Emesis  . Diarrhea  . Abdominal Pain   (Consider location/radiation/quality/duration/timing/severity/associated sxs/prior Treatment) Patient is a 79 y.o. female presenting with vomiting, diarrhea, and abdominal pain. The history is provided by the patient.  Emesis Severity:  Moderate Timing:  Constant Quality:  Unable to specify Able to tolerate:  Liquids Progression:  Worsening Chronicity:  New Recent urination:  Normal Relieved by:  Nothing Worsened by:  Nothing tried Ineffective treatments:  None tried Associated symptoms: abdominal pain and diarrhea   Abdominal pain:    Location:  Generalized   Quality:  Aching   Severity:  Moderate   Onset quality:  Sudden   Timing:  Constant   Progression:  Worsening   Chronicity:  New Diarrhea:    Quality:  Watery   Severity:  Moderate   Timing:  Constant   Progression:  Worsening Risk factors: no alcohol use and no diabetes   Diarrhea Associated symptoms: abdominal pain and vomiting   Abdominal Pain Associated symptoms: diarrhea and vomiting     Past Medical History  Diagnosis Date  . Duodenal adenoma   . Hernia   . Anemia   . Intestinal polyp   . Cancer     skin- basal cell   Past Surgical History  Procedure Laterality Date  . Tonsillectomy  1957  . Abdominal hysterectomy  1975  . Gallbladder surgery  2000  . Duodenotomy and polyp resection    . Vein surgery  right leg  . Exploratory laparotomy    . Basal cell carcinoma excision    . Hernia repair  04/21/11    Ventral hernia repair    Family History  Problem Relation Age of Onset  . Cancer Mother    History  Substance Use Topics  . Smoking status: Never Smoker   . Smokeless tobacco: Not on file  . Alcohol Use: Yes     Comment: occasionally   OB History   Grav Para Term Preterm  Abortions TAB SAB Ect Mult Living                 Review of Systems  Gastrointestinal: Positive for vomiting, abdominal pain and diarrhea.  All other systems reviewed and are negative.    Allergies  Penicillins and Sulfonamide derivatives  Home Medications   Current Outpatient Rx  Name  Route  Sig  Dispense  Refill  . aspirin 81 MG EC tablet   Oral   Take 81 mg by mouth daily.           Tyrone Schimke, Vaccinium myrtillus, (BILBERRY PO)   Oral   Take 320 mg by mouth daily.           . pediatric multivitamin with iron (AQUADEKS) LIQD   Oral   Take by mouth daily.           Marland Kitchen VITAMIN E PO   Oral   Take by mouth daily.           . Vitamins-Lipotropics (B-50 PO)   Oral   Take by mouth daily.            BP 134/90  Pulse 116  Temp(Src) 97.7 F (36.5 C) (Oral)  Resp 16  SpO2 97% Physical Exam  Nursing note and vitals reviewed. Constitutional: She is oriented  to person, place, and time. She appears well-developed and well-nourished.  Non-toxic appearance. No distress.  HENT:  Head: Normocephalic and atraumatic.  Eyes: Conjunctivae, EOM and lids are normal. Pupils are equal, round, and reactive to light.  Neck: Normal range of motion. Neck supple. No tracheal deviation present. No mass present.  Cardiovascular: Regular rhythm and normal heart sounds.  Tachycardia present.  Exam reveals no gallop.   No murmur heard. Pulmonary/Chest: Effort normal and breath sounds normal. No stridor. No respiratory distress. She has no decreased breath sounds. She has no wheezes. She has no rhonchi. She has no rales.  Abdominal: Soft. Normal appearance and bowel sounds are normal. She exhibits no distension. There is generalized tenderness. There is no rigidity, no rebound, no guarding and no CVA tenderness.  Musculoskeletal: Normal range of motion. She exhibits no edema and no tenderness.  Neurological: She is alert and oriented to person, place, and time. She has normal strength.  No cranial nerve deficit or sensory deficit. GCS eye subscore is 4. GCS verbal subscore is 5. GCS motor subscore is 6.  Skin: Skin is warm and dry. No abrasion and no rash noted.  Psychiatric: She has a normal mood and affect. Her speech is normal and behavior is normal.    ED Course   Procedures (including critical care time)  Labs Reviewed  CBC WITH DIFFERENTIAL  COMPREHENSIVE METABOLIC PANEL  URINALYSIS, ROUTINE W REFLEX MICROSCOPIC  LIPASE, BLOOD   No results found. No diagnosis found.  MDM  Patient given IV fluids and supplemental potassium for her hypokalemia. Abdominal CT without acute findings. Patient to be admitted for IV hydration as well as potassium replacement  Toy Baker, MD 03/27/13 1737

## 2013-03-28 DIAGNOSIS — R0602 Shortness of breath: Secondary | ICD-10-CM

## 2013-03-28 DIAGNOSIS — M79609 Pain in unspecified limb: Secondary | ICD-10-CM

## 2013-03-28 LAB — BASIC METABOLIC PANEL
BUN: 24 mg/dL — ABNORMAL HIGH (ref 6–23)
CO2: 20 mEq/L (ref 19–32)
Chloride: 109 mEq/L (ref 96–112)
Chloride: 110 mEq/L (ref 96–112)
GFR calc Af Amer: 90 mL/min — ABNORMAL LOW (ref >90)
GFR calc non Af Amer: 77 mL/min — ABNORMAL LOW (ref >90)
Glucose, Bld: 84 mg/dL (ref 70–99)
Glucose, Bld: 90 mg/dL (ref 70–99)
Potassium: 2.9 mEq/L — ABNORMAL LOW (ref 3.5–5.1)
Potassium: 4 mEq/L (ref 3.5–5.1)
Sodium: 137 mEq/L (ref 135–145)
Sodium: 139 mEq/L (ref 135–145)

## 2013-03-28 LAB — CLOSTRIDIUM DIFFICILE BY PCR: Toxigenic C. Difficile by PCR: NEGATIVE

## 2013-03-28 MED ORDER — ENOXAPARIN SODIUM 80 MG/0.8ML ~~LOC~~ SOLN: 1.0000 mg/kg | SUBCUTANEOUS | Status: DC

## 2013-03-28 MED ORDER — POTASSIUM CHLORIDE 10 MEQ/100ML IV SOLN
10.0000 meq | INTRAVENOUS | Status: DC
  Administered 2013-03-28: 10 meq via INTRAVENOUS
  Filled 2013-03-28: qty 100

## 2013-03-28 MED ORDER — ENOXAPARIN SODIUM 80 MG/0.8ML ~~LOC~~ SOLN
70.0000 mg | Freq: Two times a day (BID) | SUBCUTANEOUS | Status: DC
  Administered 2013-03-29: 70 mg via SUBCUTANEOUS
  Filled 2013-03-28 (×3): qty 0.8

## 2013-03-28 MED ORDER — POTASSIUM CHLORIDE CRYS ER 20 MEQ PO TBCR
40.0000 meq | EXTENDED_RELEASE_TABLET | Freq: Once | ORAL | Status: AC
  Administered 2013-03-28: 40 meq via ORAL
  Filled 2013-03-28: qty 2

## 2013-03-28 MED ORDER — ENOXAPARIN SODIUM 30 MG/0.3ML ~~LOC~~ SOLN
30.0000 mg | SUBCUTANEOUS | Status: DC
  Administered 2013-03-28: 30 mg via SUBCUTANEOUS
  Filled 2013-03-28 (×2): qty 0.3

## 2013-03-28 MED ORDER — ENOXAPARIN SODIUM 40 MG/0.4ML ~~LOC~~ SOLN
40.0000 mg | Freq: Once | SUBCUTANEOUS | Status: AC
  Administered 2013-03-28: 40 mg via SUBCUTANEOUS
  Filled 2013-03-28: qty 0.4

## 2013-03-28 NOTE — Progress Notes (Signed)
Subjective: Pt seen and examined in AM. Patient doing better with no fever, nausea, or vomiting. Still with diarrhea that is green in color but is improving. She is hungry and asking for food. Reports that when she washed her right leg today it felt prickly and noticed it is red and warm near ankle. No leg pain or shortness of breath.  No prior history of blood clots.     Objective: Vital signs in last 24 hours: Filed Vitals:   03/27/13 2224 03/27/13 2228 03/27/13 2234 03/28/13 0606  BP: 112/66 121/70 116/68 109/55  Pulse: 85 94 98 74  Temp: 97.6 F (36.4 C)   97.5 F (36.4 C)  TempSrc: Oral     Resp: 20 22 19 18   Height: 5\' 1"  (1.549 m)     Weight: 67.042 kg (147 lb 12.8 oz)     SpO2: 99%   97%   Weight change:   Intake/Output Summary (Last 24 hours) at 03/28/13 0957 Last data filed at 03/28/13 0559  Gross per 24 hour  Intake    797 ml  Output      0 ml  Net    797 ml   Constitutional: She is oriented to person, place, and time and well-developed, well-nourished, and in no distress. No distress.  Head: Normocephalic and atraumatic.  Mouth/Throat: Dry mucous membranes.  Eyes: Conjunctivae and EOM are normal   Neck: Normal range of motion. Neck supple.  Cardiovascular: Normal rate, regular rhythm, normal heart sounds and intact distal pulses. Exam reveals no gallop and no friction rub. No murmur heard.  Pulmonary/Chest: Effort normal and breath sounds normal. No respiratory distress. She has no wheezes. She has no rales. She exhibits no tenderness.  Abdominal: Soft. Bowel sounds are normal. She exhibits no distension and no mass. No abdominal tenderness. There is no rebound and no guarding. Hyperactive BS.   Musculoskeletal: Normal range of motion. She exhibits no edema and no tenderness.  Neurological: She is alert and oriented to person, place, and time.  Skin: Skin is warm and dry. No rash noted. She is not diaphoretic. No erythema. No pallor. Decreased skin turgor.    Psychiatric: Memory and affect normal.     Lab Results: Basic Metabolic Panel:  Recent Labs Lab 03/27/13 1313 03/28/13 0500  NA 132* 137  K 2.6* 2.9*  CL 97 107  CO2 16* 18*  GLUCOSE 122* 84  BUN 43* 30*  CREATININE 0.96 0.78  CALCIUM 9.9 8.6   Liver Function Tests:  Recent Labs Lab 03/27/13 1313  AST 33  ALT 30  ALKPHOS 72  BILITOT 0.7  PROT 8.2  ALBUMIN 4.4    Recent Labs Lab 03/27/13 1445  LIPASE 42   No results found for this basename: AMMONIA,  in the last 168 hours CBC:  Recent Labs Lab 03/27/13 1313 03/28/13 0500  WBC 4.8 4.9  NEUTROABS 3.0  --   HGB 17.5* 13.5  HCT 47.3* 36.8  MCV 83.1 83.8  PLT 324 241   Cardiac Enzymes: No results found for this basename: CKTOTAL, CKMB, CKMBINDEX, TROPONINI,  in the last 168 hours BNP: No results found for this basename: PROBNP,  in the last 168 hours D-Dimer: No results found for this basename: DDIMER,  in the last 168 hours CBG: No results found for this basename: GLUCAP,  in the last 168 hours Hemoglobin A1C: No results found for this basename: HGBA1C,  in the last 168 hours Fasting Lipid Panel: No results found for  this basename: CHOL, HDL, LDLCALC, TRIG, CHOLHDL, LDLDIRECT,  in the last 168 hours Thyroid Function Tests: No results found for this basename: TSH, T4TOTAL, FREET4, T3FREE, THYROIDAB,  in the last 168 hours Coagulation: No results found for this basename: LABPROT, INR,  in the last 168 hours Anemia Panel: No results found for this basename: VITAMINB12, FOLATE, FERRITIN, TIBC, IRON, RETICCTPCT,  in the last 168 hours Urine Drug Screen: Drugs of Abuse  No results found for this basename: labopia, cocainscrnur, labbenz, amphetmu, thcu, labbarb    Alcohol Level: No results found for this basename: ETH,  in the last 168 hours Urinalysis:  Recent Labs Lab 03/27/13 1904  COLORURINE YELLOW  LABSPEC <1.005*  PHURINE 6.0  GLUCOSEU NEGATIVE  HGBUR NEGATIVE  BILIRUBINUR NEGATIVE   KETONESUR 15*  PROTEINUR NEGATIVE  UROBILINOGEN 0.2  NITRITE NEGATIVE  LEUKOCYTESUR NEGATIVE   Misc. Labs:   Micro Results: Recent Results (from the past 240 hour(s))  CLOSTRIDIUM DIFFICILE BY PCR     Status: None   Collection Time    03/28/13  1:40 AM      Result Value Range Status   C difficile by pcr NEGATIVE  NEGATIVE Final   Studies/Results: Ct Abdomen Pelvis W Contrast  03/27/2013   CLINICAL DATA:  Abdominal pain with diarrhea or 4 days. History of appendectomy, hysterectomy, cholecystectomy and hernia repair.  EXAM: CT ABDOMEN AND PELVIS WITH CONTRAST  TECHNIQUE: Multidetector CT imaging of the abdomen and pelvis was performed using the standard protocol following bolus administration of intravenous contrast.  CONTRAST:  OMNIPAQUE IOHEXOL 300 MG/ML  SOLN  COMPARISON:  Abdominal pelvic CT 02/24/2011.  FINDINGS: Mild subpleural reticulation and small calcified granulomas are noted at both lung bases. There is no pleural or pericardial effusion. A small hiatal hernia is noted.  There is mildly progressive intrahepatic and extrahepatic biliary dilatation status post cholecystectomy. The common bile duct measures up to 1.5 cm in diameter. There is a duodenum diverticulum. No pancreatic mass or pancreatic ductal dilatation is seen. The liver, spleen and adrenal glands appear normal. Small left renal parapelvic cysts are noted.  There are sigmoid colon diverticular changes without surrounding inflammation. Ventral hernia repair has been performed in the interval. The anterior abdominal wall remains thinned at the level of the surgical repair, although no recurrent hernia is identified. There is no evidence of bowel obstruction.  Previously noted prominent lymph nodes in the left upper quadrant are unchanged, measuring up to 1.4 cm on image 18, superior to the left adrenal gland. There are several small calcified retroperitoneal lymph nodes. Aortoiliac atherosclerosis appears stable.  There  is no pelvic mass status post hysterectomy. The bladder appears stable.  There are stable mild degenerative changes throughout the lumbar spine.  IMPRESSION: 1.  No acute abdominal pelvic findings demonstrated.  2. Interval ventral hernia repair. No discrete recurrent hernia demonstrated.  3. Mildly progressive biliary dilatation status post cholecystectomy. This could be physiologic. Correlation with liver function studies recommended.  4. Stable prominent lymph nodes in the upper abdomen compared with prior study from 25 months ago, supporting a benign/indolent etiology.   Electronically Signed   By: Roxy Horseman   On: 03/27/2013 16:58   Medications: I have reviewed the patient's current medications. Scheduled Meds: . aspirin EC  81 mg Oral Daily  . enoxaparin (LOVENOX) injection  30 mg Subcutaneous Q24H  . potassium chloride SA  40 mEq Oral Once   Continuous Infusions: . sodium chloride 125 mL/hr at 03/28/13 [phone]  PRN Meds:.acetaminophen, acetaminophen, iohexol, ondansetron (ZOFRAN) IV, ondansetron Assessment/Plan: Principal Problem:   Diarrhea with dehydration Active Problems:   Hypokalemia  Assessment & Plan by Problem:  Mrs. Shelby Mendez is a 79 y.o. woman who is otherwise healthy presenting with nausea, vomiting, and diarrhea x5 days thought to be due to acute gastroenteritis.  #Acute gastroenteritis - Her symptoms are most consistent with severe acute gastroenteritis, likely viral given abrupt onset, large volume of vomiting accompanied by non-bloody diarrhea, efficient spread person-to-person, and lack of a pinpointable food source. I suspect rotavirus given duration of symptoms (typically 3-8 days). Norovirus is also likely as it's the most common cause of acute viral gastroenteritis in adults, and it's highly infectious; however, infection duration with this virus is usually around 2 days. CT abdomen/pelvis showed no acute abdominal process in this woman. Lipase was WNL. No chest  pain or EKG changes to indicate ischemia. C diff infection unlikely as no recent history of antibiotic use, but we will check a PCR.  - conservative management, no antibiotics indicated - advance to clear liqids - IVF NS @50  cc/hr  - Tylenol prn mild pain or fever  - Zofran prn nausea  - C. Diff negative   - BMP, CBC   -PT and PT consults  #Hypokalemia - K=2.6 on presentation, most likely secondary to diarrhea and vomiting. Good renal function.  - Given KCl IV x1, K-Dur po x1  - Replace with PO K  - Obtain BMP   Right LE Swelling - DVT vs baker's cyst vs SVT vs cellulitis, unclear if pt received scheduled lovenox.   -Obtain DUS LE   #Prerenal azotemia -  Improving, BUN/Cr 43/0.96, with a ratio = 44:1. Likely secondary to dehydration from vomiting and diarrhea.  -BUN/Cr - Continue IVF resuscitation  - Follow up repeat BMP  #Anion gap acidosis - AG = 19, Lactate WNL. Could be secondary to a mild starvation ketoacidosis given reduced po intake over the past 5 days, or from prerenal azotemia from increased reabsorption of urea 2/2 volume depletion (BRN 43). Her delta ratio is <1 so she may have a concomitant non-anion gap hyperchloremic metabolic acidosis from diarrhea.  - Continue IVF resuscitation  - Follow up repeat BMP   #DVT PPX - Lovenox subq     Dispo: Disposition is deferred at this time, awaiting improvement of current medical problems.  Anticipated discharge in approximately 1 day(s).   The patient does have a current PCP (Provider Not In System) and does need an [facility] hospital follow-up appointment after discharge.  The patient does have transportation limitations that hinder transportation to clinic appointments.  .Services Needed at time of discharge: Y = Yes, Blank = No PT:   OT:   RN:   Equipment:   Other:     LOS: 1 day   Otis Brace, MD 03/28/2013, 9:57 AM

## 2013-03-28 NOTE — Evaluation (Signed)
Physical Therapy Evaluation Patient Details Name: Shelby Mendez MRN: [id number] DOB: July 23, 1932 Today's Date: 03/28/2013 Time: 4098-1191 PT Time Calculation (min): 21 min  PT Assessment / Plan / Recommendation History of Present Illness  79 y.o. woman who is otherwise healthy presenting with nausea, vomiting, and diarrhea.  Clinical Impression  Pt admitted with N/V/D. Pt currently with functional limitations due to the deficits listed below (see PT Problem List).  Pt will benefit from skilled PT to increase their independence and safety with mobility to allow discharge to the venue listed below.  Pt found to have R LE DVT and had received Lovenox at least 30 min prior to evaluation.  Pt reports husband also became ill shortly after her and is also here at [facility] on 6E.  Pt reports her son can assist her home however he is going to [facility] on Wed but she has good church support available to assist her if d/c after Wed.  Pt feeling much better today and happy to be ambulating.  Encouraged pt to ambulate with staff.  Pt will likely progress well and not require HHPT or DME.     PT Assessment  Patient needs continued PT services    Follow Up Recommendations  No PT follow up    Does the patient have the potential to tolerate intense rehabilitation      Barriers to Discharge        Equipment Recommendations  None recommended by PT    Recommendations for Other Services     Frequency Min 3X/week    Precautions / Restrictions Precautions Precautions: Fall   Pertinent Vitals/Pain Sore/tender R lower leg (DVT), no c/o pain during ambulation     Mobility  Bed Mobility Bed Mobility: Not assessed Details for Bed Mobility Assistance: pt up in recliner on arrival Transfers Transfers: Sit to Stand;Stand to Sit Sit to Stand: 4: Min guard;With upper extremity assist;From chair/3-in-1 Stand to Sit: 4: Min guard;With upper extremity assist;To chair/3-in-1 Ambulation/Gait Ambulation/Gait Assistance:  4: Min guard Ambulation Distance (Feet): 150 Feet Assistive device: None Ambulation/Gait Assistance Details: antalgic gait as pt favoring L LE due to R LE DVT, occasionally reaching for hand rail/desk/wall however declined assistive device Gait Pattern: Step-through pattern;Decreased stance time - right;Decreased step length - left;Antalgic    Exercises     PT Diagnosis: Difficulty walking;Acute pain  PT Problem List: Decreased activity tolerance;Decreased mobility;Pain PT Treatment Interventions: DME instruction;Gait training;Functional mobility training;Therapeutic activities;Therapeutic exercise;Patient/family education;Stair training     PT Goals(Current goals can be found in the care plan section) Acute Rehab PT Goals PT Goal Formulation: With patient Time For Goal Achievement: 04/04/13 Potential to Achieve Goals: Good  Visit Information  Last PT Received On: 03/28/13 Assistance Needed: +1 History of Present Illness: 79 y.o. woman who is otherwise healthy presenting with nausea, vomiting, and diarrhea.       Prior Functioning  Home Living Family/patient expects to be discharged to:: Private residence Living Arrangements: Spouse/significant other Available Help at Discharge: Family;Friend(s);Available PRN/intermittently Type of Home: House Home Access: Stairs to enter [employer] of Steps: 3 Entrance Stairs-Rails: None Home Layout: One level Home Equipment: None Prior Function Level of Independence: Independent Communication Communication: No difficulties    Cognition  Cognition Arousal/Alertness: Awake/alert Behavior During Therapy: WFL for tasks assessed/performed Overall Cognitive Status: Within Functional Limits for tasks assessed    Extremity/Trunk Assessment Lower Extremity Assessment Lower Extremity Assessment: Overall WFL for tasks assessed   Balance    End of Session PT - End  of Session Activity Tolerance: Patient tolerated treatment  well Patient left: in chair;with call bell/phone within reach  GP     [facility] E 03/28/2013, 3:51 PM Zenovia Jarred, PT, DPT 03/28/2013 Pager: [phone]

## 2013-03-28 NOTE — H&P (Signed)
Date: 03/28/2013               Patient Name:  Shelby Mendez MRN: [id number]  DOB: [birth year] Age / Sex: 79 y.o., female   PCP: Provider Not In System         Medical Service: Internal Medicine Teaching Service         Attending Physician: Dr. Inez Catalina, MD    First Contact: Dr. Otis Brace Pager: [phone]  Second Contact: Dr. Dede Query Pager: [phone]       After Hours (After 5p/  First Contact Pager: [phone]  weekends / holidays): Second Contact Pager: [phone]   Chief Complaint: Vomiting and diarrhea  History of Present Illness:  Shelby Mendez is a 79 y.o. woman who is otherwise healthy presenting with nausea, vomiting, and diarrhea.  Her symptoms began on Wednesday (03/23/13) with an episode of sudden onset, severe diarrhea and vomiting. She had to run to the bathroom, and ended up vomiting on the floor while sitting on the toilet, because she could not find a waste receptacle in time. She had worsening emesis, throwing up multiple times per day, until yesterday when her vomiting subsided. Color is yellow. Denies blood or bile. She has been unable to keep food or liquids down. Her diarrhea has been constant since onset. Color was yellowish brown until she began taking Kaopectate several days ago, which turned her stools dark. Denies blood except for spots on the toilet paper recently when she wipes, she thinks 2/2 raw skin. She has had several small episodes of incontinence when she could not reach the bathroom in time. She also endorses diffuse, achy, moderately severe abdominal pain; subjective fever at home. She has experienced some general weakness over the course of her illness. She has been spending most of her time in bed recovering. She denies headache, visual changes, congestion, rhinorrhea, sore throat, chest pain, shortness of breath, dysuria, frequency, muscle pain, joint pain.  Of note, her husband fell ill with the same illness 2 days after her symptoms began. He  is also being evaluated in the ED tonight for nausea, vomiting, diarrhea. They deny recent travel or cruises. No recent antibiotic use. No history of drinking from streams or rivers. They do not have well water. There have been no major changes to their diet. Mrs. Brazier cooks most of the meals in their home. The night before she fell ill, they went out to eat at [employer] with their son and his family. However, they all ordered similar meals, and only Mrs. Fausto fell ill soon after.   Home Meds: . aspirin EC  81 mg Oral Daily   Current Meds: Current Facility-Administered Medications  Medication Dose Route Frequency Provider Last Rate Last Dose  . 0.9 %  sodium chloride infusion   Intravenous Continuous Toy Baker, MD 125 mL/hr at 03/27/13 2259    . acetaminophen (TYLENOL) tablet 650 mg  650 mg Oral Q6H PRN Manuela Schwartz, MD       Or  . acetaminophen (TYLENOL) suppository 650 mg  650 mg Rectal Q6H PRN Manuela Schwartz, MD      . aspirin EC tablet 81 mg  81 mg Oral Daily Manuela Schwartz, MD      . enoxaparin (LOVENOX) injection 30 mg  30 mg Subcutaneous Q24H Inez Catalina, MD      . iohexol (OMNIPAQUE) 300 MG/ML solution 25 mL  25 mL Oral PRN Medication Radiologist, MD  25 mL at 03/27/13 1421  . ondansetron (ZOFRAN) tablet 4 mg  4 mg Oral Q6H PRN Manuela Schwartz, MD       Or  . ondansetron [facility]) injection 4 mg  4 mg Intravenous Q6H PRN Manuela Schwartz, MD        Allergies: Allergies as of 03/27/2013 - Review Complete 03/27/2013  Allergen Reaction Noted  . Penicillins    . Sulfonamide derivatives     Past Medical History  Diagnosis Date  . Duodenal adenoma   . Hernia   . Anemia   . Intestinal polyp   . Cancer     skin- basal cell   Past Surgical History  Procedure Laterality Date  . Tonsillectomy  1957  . Abdominal hysterectomy  1975  . Gallbladder surgery  2000  . Duodenotomy and polyp resection    . Vein surgery   right leg  . Exploratory laparotomy    . Basal cell carcinoma excision    . Hernia repair  04/21/11    Ventral hernia repair    Family History  Problem Relation Age of Onset  . Cancer Mother    History   Social History  . Marital Status: Married    Spouse Name: N/A    Number of Children: N/A  . Years of Education: N/A   Occupational History  . Not on file.   Social History Main Topics  . Smoking status: Never Smoker   . Smokeless tobacco: Not on file  . Alcohol Use: Yes     Comment: occasionally  . Drug Use: No  . Sexual Activity: Not on file   Other Topics Concern  . Not on file   Social History Narrative  . No narrative on file    Review of Systems: Pertinent items are noted in HPI.  Physical Exam: Blood pressure 109/55, pulse 74, temperature 97.5 F (36.4 C), temperature source Oral, resp. rate 18, height 5\' 1"  (1.549 m), weight 147 lb 12.8 oz (67.042 kg), SpO2 97.00%. Physical Exam  Constitutional: She is oriented to person, place, and time and well-developed, well-nourished, and in no distress. No distress.  HENT:  Head: Normocephalic and atraumatic.  Mouth/Throat: No oropharyngeal exudate.  Dry mucous membranes.  Eyes: Conjunctivae and EOM are normal. Pupils are equal, round, and reactive to light. No scleral icterus.  Neck: Normal range of motion. Neck supple.  Cardiovascular: Normal rate, regular rhythm, normal heart sounds and intact distal pulses.  Exam reveals no gallop and no friction rub.   No murmur heard. Pulmonary/Chest: Effort normal and breath sounds normal. No respiratory distress. She has no wheezes. She has no rales. She exhibits no tenderness.  Abdominal: Soft. Bowel sounds are normal. She exhibits no distension and no mass. There is tenderness (Mild diffuse tenderness). There is no rebound and no guarding.  Active bowel sounds.  Musculoskeletal: Normal range of motion. She exhibits no edema and no tenderness.  Lymphadenopathy:    She has  no cervical adenopathy.  Neurological: She is alert and oriented to person, place, and time. No cranial nerve deficit. GCS score is 15.  Skin: Skin is warm and dry. No rash noted. She is not diaphoretic. No erythema. No pallor.  Decreased skin turgor.  Psychiatric: Memory and affect normal.    Lab results: Basic Metabolic Panel:  Recent Labs  16/10/96 1313  NA 132*  K 2.6*  CL 97  CO2 16*  GLUCOSE 122*  BUN 43*  CREATININE 0.96  CALCIUM 9.9  Anion Gap 19  Liver Function Tests:  Recent Labs  03/27/13 1313  AST 33  ALT 30  ALKPHOS 72  BILITOT 0.7  PROT 8.2  ALBUMIN 4.4    Recent Labs  03/27/13 1445  LIPASE 42   CBC:  Recent Labs  03/27/13 1313 03/28/13 0500  WBC 4.8 4.9  NEUTROABS 3.0  --   HGB 17.5* 13.5  HCT 47.3* 36.8  MCV 83.1 83.8  PLT 324 241   Urinalysis:  Recent Labs  03/27/13 1904  COLORURINE YELLOW  LABSPEC <1.005*  PHURINE 6.0  GLUCOSEU NEGATIVE  HGBUR NEGATIVE  BILIRUBINUR NEGATIVE  KETONESUR 15*  PROTEINUR NEGATIVE  UROBILINOGEN 0.2  NITRITE NEGATIVE  LEUKOCYTESUR NEGATIVE   Misc. Labs: Lactic Acid, Venous  Date Value Range Status  03/27/2013 1.17  0.5 - 2.2 mmol/L Final    Imaging results:  Ct Abdomen Pelvis W Contrast  03/27/2013   CLINICAL DATA:  Abdominal pain with diarrhea or 4 days. History of appendectomy, hysterectomy, cholecystectomy and hernia repair.  EXAM: CT ABDOMEN AND PELVIS WITH CONTRAST  TECHNIQUE: Multidetector CT imaging of the abdomen and pelvis was performed using the standard protocol following bolus administration of intravenous contrast.  CONTRAST:  OMNIPAQUE IOHEXOL 300 MG/ML  SOLN  COMPARISON:  Abdominal pelvic CT 02/24/2011.  FINDINGS: Mild subpleural reticulation and small calcified granulomas are noted at both lung bases. There is no pleural or pericardial effusion. A small hiatal hernia is noted.  There is mildly progressive intrahepatic and extrahepatic biliary dilatation status post  cholecystectomy. The common bile duct measures up to 1.5 cm in diameter. There is a duodenum diverticulum. No pancreatic mass or pancreatic ductal dilatation is seen. The liver, spleen and adrenal glands appear normal. Small left renal parapelvic cysts are noted.  There are sigmoid colon diverticular changes without surrounding inflammation. Ventral hernia repair has been performed in the interval. The anterior abdominal wall remains thinned at the level of the surgical repair, although no recurrent hernia is identified. There is no evidence of bowel obstruction.  Previously noted prominent lymph nodes in the left upper quadrant are unchanged, measuring up to 1.4 cm on image 18, superior to the left adrenal gland. There are several small calcified retroperitoneal lymph nodes. Aortoiliac atherosclerosis appears stable.  There is no pelvic mass status post hysterectomy. The bladder appears stable.  There are stable mild degenerative changes throughout the lumbar spine.  IMPRESSION: 1.  No acute abdominal pelvic findings demonstrated.  2. Interval ventral hernia repair. No discrete recurrent hernia demonstrated.  3. Mildly progressive biliary dilatation status post cholecystectomy. This could be physiologic. Correlation with liver function studies recommended.  4. Stable prominent lymph nodes in the upper abdomen compared with prior study from 25 months ago, supporting a benign/indolent etiology.   Electronically Signed   By: Roxy Horseman   On: 03/27/2013 16:58    Other results: EKG: normal EKG, normal sinus rhythm, unchanged from previous tracings.  Assessment & Plan by Problem: Shelby Mendez is a 79 y.o. woman who is otherwise healthy presenting with nausea, vomiting, and diarrhea x5 days.  #Acute gastroenteritis - Her symptoms are most consistent with severe acute gastroenteritis, likely viral given abrupt onset, large volume of vomiting accompanied by non-bloody diarrhea, efficient spread  person-to-person, and lack of a pinpointable food source. I suspect rotavirus given duration of symptoms (typically 3-8 days). Norovirus is also likely as it's the most common cause of acute viral gastroenteritis in adults, and it's highly infectious; however, infection  duration with this virus is usually around 2 days. CT abdomen/pelvis showed no acute abdominal process in this woman. Lipase was WNL. No chest pain or EKG changes to indicate ischemia. C diff infection unlikely as no recent history of antibiotic use, but we will check a PCR. - Admit to IMTS - Keep NPO - IVF NS @125cc /hr - Tylenol prn mild pain or fever - Zofran prn nausea - Follow up c diff, enteric precautions until resulted - Follow up repeat BMP, am CBC  #Hypokalemia - K=2.6 on presentation, most likely secondary to diarrhea and vomiting. Good renal function. - Given KCl IV x1, K-Dur po x1 - Follow up repeat BMP  #Prerenal azotemia - BUN/Cr 43/0.96, with a ratio = 44:1. Likely secondary to dehydration from vomiting and diarrhea. - Continue IVF resuscitation - Follow up repeat BMP  #Anion gap acidosis - AG = 19, Lactate WNL. Could be secondary to a mild starvation ketoacidosis given reduced po intake over the past 5 days, or from prerenal azotemia from increased reabsorption of urea 2/2 volume depletion (BRN 43). Her delta ratio is <1 so she may have a concomitant non-anion gap hyperchloremic metabolic acidosis from diarrhea. - Continue IVF resuscitation - Follow up repeat BMP  #DVT PPX - Lovenox subq  Dispo: Disposition is deferred at this time, awaiting improvement of current medical problems. Anticipated discharge in approximately 1-3 day(s).   The patient unsure have a current PCP (Provider Not In System) and does need an [facility] hospital follow-up appointment after discharge.  The patient does not have transportation limitations that hinder transportation to clinic appointments.  Signed: Vivi Barrack,  MD 03/28/2013, 6:41 AM

## 2013-03-28 NOTE — Progress Notes (Addendum)
03-28-13 Benefits check for Xarelto if she is on 15 mg po BID x 21 days then 20 mg po daily for a treatment course of 3-6 months.    PER REP AT [facility], NO AUTH IS REQUIRED  IF PATIENT GETS UNDER A 90 DAY SUPPLY, PATIENT PAYS A PERCENT OF THE TOTAL COST OF THE MEDICATION IN THIS CASE FOR 15 MG BID FOR 21 DAYS, PATIENT WOULD PAY APPROX $175, IF PATIENT WERE TO GET 90 DAY SUPPLY AT ANY [facility] OR MAIL ORDER PATIENT WOULD HAVE A FLAT RATE CO-PAY OF $80  Spoke to Xarelto rep Gildardo Pounds patient is eligible for  Xarelto [facility] 30 day free trail card , which would cover Xarelto 15 mg po BID x 21 days, than she could get 90 day supply at [facility] for $80 .   Per Mr Aundria Rud  patient are  not eligible for Xarelto  co pay card.   Above explained to patient .  Xarelto [facility] 30 day free trail card given and explained to patient .   Patient would need a prescription for Xarelto 15 mg po BID x 21 days, and prescription for Xarelto 20 mg PO daily x 90 days     Ronny Flurry RN BSN [phone]

## 2013-03-28 NOTE — H&P (Signed)
IM ATTENDING H&P  Date: 03/28/2013  Patient name: Shelby Mendez  Medical record number: [id number]  Date of birth: [birth year]   This patient has been seen and the plan of care was discussed with the house staff. Please see their note for complete details. I concur with their findings with the following additions/corrections:  I personally saw the patient and confirmed the history.  [name] is a 79yo woman with limited PMH who presented with acute onset of nausea, vomiting and diarrhea.  She subsequently was not able to keep anything PO down and had abdominal pain and subjective fevers.  Her husband fell ill 2 days after her and they both presented to the hospital.  Since being here, she has not vomited and her diarrhea has slowed down.  She is Cdiff negative.  The issue came on acutely after eating at [facility], but no one else was acutely ill at that time, making food poisoning less likely.  This is most likely viral gastroenteritis.  She is improving and feels better with fluids.  She also had a very low K.  She is not able to tolerate IV K, so she will have PO K replacement and frequent checks of her BMP.  This morning, she reports a new erythema and swelling, worse on the left ankle, but somewhat on the right as well.    On exam, she is an elderly woman in NAD. She is better hydrated this morning, RR, NR.  No abdominal tenderness or swelling.  Mild non pitting edema to bilateral ankles.  Erythema to right ankle above the malleolus, extends around the ankle.  Mild red patch on medial malleolus on the left.   Will plan to treat conservatively with fluids and K replacement.  Attempt clears today as she is feeling hungry.  Doppler right leg for possibly dvt, unclear if she received chemical ppx and she has been somewhat bedbound since being ill.  Could also be a manifestation of her viral illness (viral exanthem).  Monitor closely today.   Inez Catalina, MD 03/28/2013, 11:09 AM

## 2013-03-28 NOTE — Progress Notes (Signed)
Bilateral lower extremity venous duplex completed.  Right:  DVT noted in the popliteal vein.  Partial DVT or Fibrous strand noted in the common femoral vein.  No evidence of superficial thrombosis.  No Baker's cyst.  Left:  No evidence of DVT, superficial thrombosis, or Baker's cyst.

## 2013-03-28 NOTE — Progress Notes (Signed)
During admission pt's valuable were collected and accounted for and sent to security. Receipt of transaction and key is in front patients' chart. Pt has been made aware of policy regarding hold and return of valuables during hospital stay.

## 2013-03-28 NOTE — Progress Notes (Signed)
ANTICOAGULATION CONSULT NOTE - Initial Consult  Pharmacy Consult for lovenox Indication: DVT  Allergies  Allergen Reactions  . Penicillins   . Sulfonamide Derivatives     Patient Measurements: Height: 5\' 1"  (154.9 cm) Weight: 147 lb 12.8 oz (67.042 kg) IBW/kg (Calculated) : 47.8   Vital Signs: Temp: 97.5 F (36.4 C) (08/25 0606) BP: 109/55 mmHg (08/25 0606) Pulse Rate: 74 (08/25 0606)  Labs:  Recent Labs  03/27/13 1313 03/28/13 0500  HGB 17.5* 13.5  HCT 47.3* 36.8  PLT 324 241  CREATININE 0.96 0.78    Estimated Creatinine Clearance: 50 ml/min (by C-G formula based on Cr of 0.78).   Medical History: Past Medical History  Diagnosis Date  . Duodenal adenoma   . Hernia   . Anemia   . Intestinal polyp   . Cancer     skin- basal cell    Medications:  Prescriptions prior to admission  Medication Sig Dispense Refill  . aspirin 81 MG EC tablet Take 81 mg by mouth daily.        . DiphenhydrAMINE HCl (ALLERGY MEDICATION PO) Take 1 tablet by mouth daily.      Chilton Si Tea, Camillia sinensis, (GREEN TEA PO) Take 650 mg by mouth daily.      . Multiple Vitamins-Minerals (OCUVITE ADULT 50+ PO) Take 1 tablet by mouth daily.      . Multiple Vitamins-Minerals (WOMENS 50+ MULTI VITAMIN/MIN PO) Take 1 tablet by mouth daily.      . Nutritional Supplements (GRAPESEED EXTRACT) 500-50 MG CAPS Take 1 tablet by mouth daily.      Marland Kitchen thiamine (VITAMIN B-1) 100 MG tablet Take 100 mg by mouth daily.      Marland Kitchen VITAMIN E PO Take 800 Units by mouth daily.         Assessment: 79 yo lady to start lovenox for DVT.  She received lovenox 30 mg sq this am. Hg 13.5, PTLC 241.  CrCl ~50 ml/min. Goal of Therapy:  Anti-Xa level 0.6-1.2 units/ml 4hrs after LMWH dose given Monitor platelets by anticoagulation protocol: Yes   Plan:  Lovenox 40 mg sq now then 70 mg sq q12 hours. Check CBC q 3 days while on lovenox.  Talbert Cage Poteet 03/28/2013,2:21 PM

## 2013-03-29 LAB — BASIC METABOLIC PANEL
BUN: 16 mg/dL (ref 6–23)
BUN: 13 mg/dL (ref 6–23)
CO2: 19 mEq/L (ref 19–32)
CO2: 20 mEq/L (ref 19–32)
Chloride: 113 mEq/L — ABNORMAL HIGH (ref 96–112)
Chloride: 110 mEq/L (ref 96–112)
Creatinine, Ser: 0.54 mg/dL (ref 0.50–1.10)
Creatinine, Ser: 0.56 mg/dL (ref 0.50–1.10)

## 2013-03-29 LAB — CBC
Hemoglobin: 13.5 g/dL (ref 12.0–15.0)
RBC: 4.39 MIL/uL (ref 3.87–5.11)

## 2013-03-29 MED ORDER — POTASSIUM CHLORIDE CRYS ER 20 MEQ PO TBCR
40.0000 meq | EXTENDED_RELEASE_TABLET | Freq: Once | ORAL | Status: AC
  Administered 2013-03-29: 40 meq via ORAL
  Filled 2013-03-29: qty 2

## 2013-03-29 MED ORDER — RIVAROXABAN 15 MG PO TABS
15.0000 mg | ORAL_TABLET | Freq: Two times a day (BID) | ORAL | Status: DC
  Administered 2013-03-29: 15 mg via ORAL
  Filled 2013-03-29 (×2): qty 1

## 2013-03-29 MED ORDER — RIVAROXABAN 20 MG PO TABS: 20.0000 mg | ORAL_TABLET | Freq: Every day | ORAL | Status: DC

## 2013-03-29 MED ORDER — RIVAROXABAN 15 MG PO TABS
15.0000 mg | ORAL_TABLET | Freq: Two times a day (BID) | ORAL | Status: DC
  Filled 2013-03-29 (×2): qty 1

## 2013-03-29 MED ORDER — MAGNESIUM OXIDE 400 (241.3 MG) MG PO TABS
200.0000 mg | ORAL_TABLET | Freq: Two times a day (BID) | ORAL | Status: DC
  Administered 2013-03-29: 200 mg via ORAL
  Filled 2013-03-29 (×2): qty 0.5

## 2013-03-29 MED ORDER — RIVAROXABAN 15 MG PO TABS: 15.0000 mg | ORAL_TABLET | Freq: Two times a day (BID) | ORAL | Status: DC

## 2013-03-29 NOTE — Progress Notes (Signed)
ANTICOAGULATION CONSULT NOTE   Pharmacy Consult for xarelto Indication: DVT  Allergies  Allergen Reactions  . Penicillins   . Sulfonamide Derivatives     Patient Measurements: Height: 5\' 1"  (154.9 cm) Weight: 147 lb 12.8 oz (67.042 kg) IBW/kg (Calculated) : 47.8   Vital Signs: Temp: 97.5 F (36.4 C) (08/26 0601) Temp src: Oral (08/25 2325) BP: 115/57 mmHg (08/26 0601) Pulse Rate: 70 (08/26 0601)  Labs:  Recent Labs  03/27/13 1313 03/28/13 0500 03/28/13 1504 03/28/13 1757 03/29/13 0455  HGB 17.5* 13.5  --   --   --   HCT 47.3* 36.8  --   --   --   PLT 324 241  --   --   --   CREATININE 0.96 0.78 0.67 0.61 0.54    Estimated Creatinine Clearance: 50 ml/min (by C-G formula based on Cr of 0.54).   Medical History: Past Medical History  Diagnosis Date  . Duodenal adenoma   . Hernia   . Anemia   . Intestinal polyp   . Cancer     skin- basal cell    Medications:  Prescriptions prior to admission  Medication Sig Dispense Refill  . aspirin 81 MG EC tablet Take 81 mg by mouth daily.        . DiphenhydrAMINE HCl (ALLERGY MEDICATION PO) Take 1 tablet by mouth daily.      Chilton Si Tea, Camillia sinensis, (GREEN TEA PO) Take 650 mg by mouth daily.      . Multiple Vitamins-Minerals (OCUVITE ADULT 50+ PO) Take 1 tablet by mouth daily.      . Multiple Vitamins-Minerals (WOMENS 50+ MULTI VITAMIN/MIN PO) Take 1 tablet by mouth daily.      . Nutritional Supplements (GRAPESEED EXTRACT) 500-50 MG CAPS Take 1 tablet by mouth daily.      Marland Kitchen thiamine (VITAMIN B-1) 100 MG tablet Take 100 mg by mouth daily.      Marland Kitchen VITAMIN E PO Take 800 Units by mouth daily.         Assessment: 79 yo lady  Started on lovenox for DVT now to change to xarelto.  CrCl ~50 ml/min. Goal of Therapy:  Monitor platelets by anticoagulation protocol: Yes   Plan:  Xarelto 15 mg po bid x 21 days then 20 mg daily.  Shelby Mendez 03/29/2013,10:40 AM

## 2013-03-29 NOTE — Progress Notes (Signed)
OT Cancellation Note  Patient Details Name: TEIRA ARCILLA MRN: [id number] DOB: 1931/10/13   Cancelled Treatment:    Reason Eval/Treat Not Completed: OT screened, no needs identified, will sign off  Jeani Hawking M [phone] 03/29/2013, 12:14 PM

## 2013-03-29 NOTE — Progress Notes (Addendum)
Subjective:  Pt seen and examined. No acute events overnight.  She is feeling well.  Pt tolerated clear liquid diet yesterday with no abdominal pain, nausea, or vomiting. Had some loose stools after starting clear liquids. Right leg tenderness is improving. No dyspnea, chest pain, palpitations, or diaphoresis.      Objective: Vital signs in last 24 hours: Filed Vitals:   03/28/13 0606 03/28/13 1445 03/28/13 2325 03/29/13 0601  BP: 109/55 76/54 123/54 115/57  Pulse: 74 79 74 70  Temp: 97.5 F (36.4 C) 97.4 F (36.3 C) 97.4 F (36.3 C) 97.5 F (36.4 C)  TempSrc:  Oral Oral   Resp: 18 18 18 17   Height:      Weight:      SpO2: 97% 100% 100% 100%   Weight change:   Intake/Output Summary (Last 24 hours) at 03/29/13 0960 Last data filed at 03/28/13 1300  Gross per 24 hour  Intake    480 ml  Output      0 ml  Net    480 ml   Constitutional: She is oriented to person, place, and time and well-developed, well-nourished, and in no distress. No distress.  Head: Normocephalic and atraumatic.  Eyes: Conjunctivae and EOM are normal   Neck: Normal range of motion. Neck supple.  Cardiovascular: Normal rate, regular rhythm, normal heart sounds and intact distal pulses. Exam reveals no gallop and no friction rub. No murmur heard.  Pulmonary/Chest: Effort normal and breath sounds normal. No respiratory distress. She has no wheezes. She has no rales. She exhibits no tenderness.  Abdominal: Soft. Bowel sounds are normal. She exhibits no distension and no mass. No abdominal tenderness. There is no rebound and no guarding. Hyperactive BS.   Musculoskeletal: Normal range of motion. She exhibits no edema and no tenderness.  Neurological: She is alert and oriented to person, place, and time.  Skin: Skin is warm and dry. No rash noted. She is not diaphoretic. No erythema. No pallor. Psychiatric: Memory and affect normal.     Lab Results: Basic Metabolic Panel:  Recent Labs Lab 03/28/13 1757  03/29/13 0455  NA 138 140  K 4.0 3.4*  CL 110 113*  CO2 16* 19  GLUCOSE 90 93  BUN 24* 16  CREATININE 0.61 0.54  CALCIUM 9.0 8.6  MG  --  1.9   Liver Function Tests:  Recent Labs Lab 03/27/13 1313  AST 33  ALT 30  ALKPHOS 72  BILITOT 0.7  PROT 8.2  ALBUMIN 4.4    Recent Labs Lab 03/27/13 1445  LIPASE 42   No results found for this basename: AMMONIA,  in the last 168 hours CBC:  Recent Labs Lab 03/27/13 1313 03/28/13 0500  WBC 4.8 4.9  NEUTROABS 3.0  --   HGB 17.5* 13.5  HCT 47.3* 36.8  MCV 83.1 83.8  PLT 324 241   Cardiac Enzymes: No results found for this basename: CKTOTAL, CKMB, CKMBINDEX, TROPONINI,  in the last 168 hours BNP: No results found for this basename: PROBNP,  in the last 168 hours D-Dimer: No results found for this basename: DDIMER,  in the last 168 hours CBG: No results found for this basename: GLUCAP,  in the last 168 hours Hemoglobin A1C: No results found for this basename: HGBA1C,  in the last 168 hours Fasting Lipid Panel: No results found for this basename: CHOL, HDL, LDLCALC, TRIG, CHOLHDL, LDLDIRECT,  in the last 168 hours Thyroid Function Tests: No results found for this basename: TSH, T4TOTAL,  FREET4, T3FREE, THYROIDAB,  in the last 168 hours Coagulation: No results found for this basename: LABPROT, INR,  in the last 168 hours Anemia Panel: No results found for this basename: VITAMINB12, FOLATE, FERRITIN, TIBC, IRON, RETICCTPCT,  in the last 168 hours Urine Drug Screen: Drugs of Abuse  No results found for this basename: labopia,  cocainscrnur,  labbenz,  amphetmu,  thcu,  labbarb    Alcohol Level: No results found for this basename: ETH,  in the last 168 hours Urinalysis:  Recent Labs Lab 03/27/13 1904  COLORURINE YELLOW  LABSPEC <1.005*  PHURINE 6.0  GLUCOSEU NEGATIVE  HGBUR NEGATIVE  BILIRUBINUR NEGATIVE  KETONESUR 15*  PROTEINUR NEGATIVE  UROBILINOGEN 0.2  NITRITE NEGATIVE  LEUKOCYTESUR NEGATIVE   Misc.  Labs:   Micro Results: Recent Results (from the past 240 hour(s))  CLOSTRIDIUM DIFFICILE BY PCR     Status: None   Collection Time    03/28/13  1:40 AM      Result Value Range Status   C difficile by pcr NEGATIVE  NEGATIVE Final   Studies/Results: Ct Abdomen Pelvis W Contrast  03/27/2013   CLINICAL DATA:  Abdominal pain with diarrhea or 4 days. History of appendectomy, hysterectomy, cholecystectomy and hernia repair.  EXAM: CT ABDOMEN AND PELVIS WITH CONTRAST  TECHNIQUE: Multidetector CT imaging of the abdomen and pelvis was performed using the standard protocol following bolus administration of intravenous contrast.  CONTRAST:  OMNIPAQUE IOHEXOL 300 MG/ML  SOLN  COMPARISON:  Abdominal pelvic CT 02/24/2011.  FINDINGS: Mild subpleural reticulation and small calcified granulomas are noted at both lung bases. There is no pleural or pericardial effusion. A small hiatal hernia is noted.  There is mildly progressive intrahepatic and extrahepatic biliary dilatation status post cholecystectomy. The common bile duct measures up to 1.5 cm in diameter. There is a duodenum diverticulum. No pancreatic mass or pancreatic ductal dilatation is seen. The liver, spleen and adrenal glands appear normal. Small left renal parapelvic cysts are noted.  There are sigmoid colon diverticular changes without surrounding inflammation. Ventral hernia repair has been performed in the interval. The anterior abdominal wall remains thinned at the level of the surgical repair, although no recurrent hernia is identified. There is no evidence of bowel obstruction.  Previously noted prominent lymph nodes in the left upper quadrant are unchanged, measuring up to 1.4 cm on image 18, superior to the left adrenal gland. There are several small calcified retroperitoneal lymph nodes. Aortoiliac atherosclerosis appears stable.  There is no pelvic mass status post hysterectomy. The bladder appears stable.  There are stable mild degenerative  changes throughout the lumbar spine.  IMPRESSION: 1.  No acute abdominal pelvic findings demonstrated.  2. Interval ventral hernia repair. No discrete recurrent hernia demonstrated.  3. Mildly progressive biliary dilatation status post cholecystectomy. This could be physiologic. Correlation with liver function studies recommended.  4. Stable prominent lymph nodes in the upper abdomen compared with prior study from 25 months ago, supporting a benign/indolent etiology.   Electronically Signed   By: Roxy Horseman   On: 03/27/2013 16:58   Medications: I have reviewed the patient's current medications. Scheduled Meds: . aspirin EC  81 mg Oral Daily  . enoxaparin (LOVENOX) injection  70 mg Subcutaneous Q12H  . magnesium oxide  200 mg Oral BID  . potassium chloride SA  40 mEq Oral Once   Continuous Infusions: . sodium chloride 50 mL/hr at 03/29/13 0237   PRN Meds:.acetaminophen, acetaminophen, iohexol, ondansetron (ZOFRAN) IV, ondansetron Assessment/Plan: Principal  Problem:   Diarrhea with dehydration Active Problems:   Hypokalemia  Assessment & Plan by Problem:  Mrs. Shelby Mendez is a 79 y.o. woman who is otherwise healthy presenting with nausea, vomiting, and diarrhea x5 days thought to be due to acute gastroenteritis.   #Acute gastroenteritis -resovling.  Her symptoms are most consistent with severe acute gastroenteritis, likely viral given abrupt onset, large volume of vomiting accompanied by non-bloody diarrhea, efficient spread person-to-person, and lack of a pinpointable food source. I suspect rotavirus given duration of symptoms (typically 3-8 days). Norovirus is also likely as it's the most common cause of acute viral gastroenteritis in adults, and it's highly infectious; however, infection duration with this virus is usually around 2 days. CT abdomen/pelvis showed no acute abdominal process in this woman. Lipase was WNL. No chest pain or EKG changes to indicate ischemia. C diff infection  unlikely as no recent history of antibiotic use, but we will check a PCR.  - conservative management, no antibiotics indicated - advance to full liquids, then solids today -  D/c IV fluids - Tylenol prn mild pain or fever  - Zofran prn nausea  - C. Diff negative    -Replete electroltyes as needed, K>4, Mg>2  -PT consult - no further PT follow-up needed  # Unprovoked DVT RLE  - 1st episode, possible  hypercoagulable state (genetic vs acquired - immobility vs possible malignancy?)    -LE b/l DUS 8/25 =  acute deep vein thrombosis involving the right popliteal vein. Partial DVT or fibrous strand noted in the right common femoral vein. An enlarged inguinal lymph node is noted. -currently on lovenox BID - per pharmacy -To start xarelto 15mg  BID for 3 weeks then 20mg  daily for 3-6 months  -Case management regarding medication affordability - pt will be able to afford cost of 6 month supply ($160) -Stop aspirin -will need further outpatient follow-up for etiology of 1st eunprovoked DVT  #Hypokalemia - resolved. K=2.6 on presentation, most likely secondary to diarrhea and vomiting vs hypomagnesemia   - K 3.4 --> replete with PO potassium -->repeat BMP K 3.7 -Mg 1.9 -->replete with PO MgOxide 200mg      #Prerenal azotemia - resolved ;  BUN/Cr 43/0.96, with a ratio = 44:1. Likely secondary to dehydration from vomiting and diarrhea.  -BUN/Cr: 16/0.54 -Resolved with IV fluids    #Anion gap acidosis - resolved ; AG = 19 on presentation, Lactate WNL. Could be secondary to a mild starvation ketoacidosis given reduced po intake over the past 5 days, or from prerenal azotemia from increased reabsorption of urea 2/2 volume depletion (BRN 43). Her delta ratio is <1 so she may have a concomitant non-anion gap hyperchloremic metabolic acidosis from diarrhea.  -Anion gap 8  -Resolved with IV fluids  #DVT PPX - Lovenox subq     Dispo: Disposition is deferred at this time, awaiting improvement of  current medical problems.  Anticipated discharge is today.   The patient does have a current PCP (Provider Not In System) and does need an [facility] hospital follow-up appointment after discharge.  The patient does have transportation limitations that hinder transportation to clinic appointments.  .Services Needed at time of discharge: Y = Yes, Blank = No PT:   OT:   RN:   Equipment:   Other:     LOS: 2 days   Otis Brace, MD 03/29/2013, 7:27 AM

## 2013-03-29 NOTE — Discharge Summary (Signed)
Name: Shelby Mendez MRN: [id number] DOB: 08-18-1931 79 y.o. PCP: Provider Not In System  Date of Admission: 03/27/2013  1:31 PM Date of Discharge: 03/29/2013 Attending Physician: Inez Catalina, MD  Discharge Diagnosis: 1.  Principal Problem:   Diarrhea with dehydration Active Problems:   Hypokalemia Right lower extremity DVT, unprovoked   Discharge Medications:   Medication List    STOP taking these medications       aspirin 81 MG EC tablet      TAKE these medications       ALLERGY MEDICATION PO  Take 1 tablet by mouth daily.     Grapeseed Extract 500-50 MG Caps  Take 1 tablet by mouth daily.     GREEN TEA PO  Take 650 mg by mouth daily.     Rivaroxaban 15 MG Tabs tablet  Commonly known as:  XARELTO  Take 1 tablet (15 mg total) by mouth 2 (two) times daily with a meal.     Rivaroxaban 20 MG Tabs tablet  Commonly known as:  XARELTO  Take 1 tablet (20 mg total) by mouth daily with supper.  Start taking on:  04/20/2013     thiamine 100 MG tablet  Commonly known as:  VITAMIN B-1  Take 100 mg by mouth daily.     VITAMIN E PO  Take 800 Units by mouth daily.     WOMENS 50+ MULTI VITAMIN/MIN PO  Take 1 tablet by mouth daily.     OCUVITE ADULT 50+ PO  Take 1 tablet by mouth daily.        Disposition and follow-up:   Shelby Mendez was discharged from [facility] in stable condition.  At the hospital follow up visit please address:  1.  Xarelto duration for 1st episode unprovoked DVT; monitor compliance and bleeding      Workup for etiology of unprovoked DVT -  genetic vs acquired thrombophilia (appropriate cancer screening)   2.  Labs / imaging needed at time of follow-up:  BMP & Mg (K. 3.7 Mg 1.9 on discharge)  3.  Pending labs/ test needing follow-up: none  Follow-up Appointments:     Follow-up Information   Follow up with Farley Ly, MD On 04/06/2013. (10AM, arrive at 9:45AM)    Specialty:  Internal Medicine   Contact  information:   [address] [place] [address] [phone]       Follow up with Iona Hansen, NP On 04/06/2013. (at 3:00 PM)    Specialty:  Nurse Practitioner   Contact information:   [address] [place] [address] [phone]       Discharge Instructions:  Future Appointments Provider Department Dept Phone   04/06/2013 10:00 AM Lars Masson, MD [facility] [phone]      Consultations:    Procedures Performed:  Ct Abdomen Pelvis W Contrast  03/27/2013   CLINICAL DATA:  Abdominal pain with diarrhea or 4 days. History of appendectomy, hysterectomy, cholecystectomy and hernia repair.  EXAM: CT ABDOMEN AND PELVIS WITH CONTRAST  TECHNIQUE: Multidetector CT imaging of the abdomen and pelvis was performed using the standard protocol following bolus administration of intravenous contrast.  CONTRAST:  OMNIPAQUE IOHEXOL 300 MG/ML  SOLN  COMPARISON:  Abdominal pelvic CT 02/24/2011.  FINDINGS: Mild subpleural reticulation and small calcified granulomas are noted at both lung bases. There is no pleural or pericardial effusion. A small hiatal hernia is noted.  There is mildly progressive intrahepatic and  extrahepatic biliary dilatation status post cholecystectomy. The common bile duct measures up to 1.5 cm in diameter. There is a duodenum diverticulum. No pancreatic mass or pancreatic ductal dilatation is seen. The liver, spleen and adrenal glands appear normal. Small left renal parapelvic cysts are noted.  There are sigmoid colon diverticular changes without surrounding inflammation. Ventral hernia repair has been performed in the interval. The anterior abdominal wall remains thinned at the level of the surgical repair, although no recurrent hernia is identified. There is no evidence of bowel obstruction.  Previously noted prominent lymph nodes in the left upper quadrant are unchanged, measuring up to 1.4 cm on image 18, superior to the left adrenal  gland. There are several small calcified retroperitoneal lymph nodes. Aortoiliac atherosclerosis appears stable.  There is no pelvic mass status post hysterectomy. The bladder appears stable.  There are stable mild degenerative changes throughout the lumbar spine.  IMPRESSION: 1.  No acute abdominal pelvic findings demonstrated.  2. Interval ventral hernia repair. No discrete recurrent hernia demonstrated.  3. Mildly progressive biliary dilatation status post cholecystectomy. This could be physiologic. Correlation with liver function studies recommended.  4. Stable prominent lymph nodes in the upper abdomen compared with prior study from 25 months ago, supporting a benign/indolent etiology.   Electronically Signed   By: Roxy Horseman   On: 03/27/2013 16:58    03/28/2013 - Noninvasive Vascular Lab  Bilateral Lower Extremity Venous Duplex Evaluation  Study information:  Portable. Study status: Routine. Procedure: A vascular evaluation was performed with the patient in the supine position. The right common femoral, right femoral, right greater saphenous, right profunda femoral, right popliteal, right peroneal, right posterior tibial, left common femoral, left femoral, left greater saphenous, left profunda femoral, left popliteal, left peroneal, and left posterior tibial veins were studied. Image quality was adequate. Bilateral lower extremity venous duplex evaluation. Doppler flow study including B-mode compression maneuvers of all visualized segments, color flow Doppler and selected views of pulsed wave Doppler. Location: Bedside. Patient status: Inpatient. Venous flow: - Findings consistent with acute deep vein thrombosis involving the right popliteal vein. Partial DVT or fibrous strand noted in the right common femoral vein. An enlarged inguinal lymph node is noted. - No evidence of deep vein thrombosis involving the left lower extremity. - No evidence of Baker's cyst on the right or  left.    2D Echo: none  Cardiac Cath: none  Admission HPI:  Original Author Vivi Barrack, MD  Shelby Mendez is a 79 y.o. woman who is otherwise healthy presenting with nausea, vomiting, and diarrhea.  Her symptoms began on Wednesday (03/23/13) with an episode of sudden onset, severe diarrhea and vomiting. She had to run to the bathroom, and ended up vomiting on the floor while sitting on the toilet, because she could not find a waste receptacle in time. She had worsening emesis, throwing up multiple times per day, until yesterday when her vomiting subsided. Color is yellow. Denies blood or bile. She has been unable to keep food or liquids down. Her diarrhea has been constant since onset. Color was yellowish brown until she began taking Kaopectate several days ago, which turned her stools dark. Denies blood except for spots on the toilet paper recently when she wipes, she thinks 2/2 raw skin. She has had several small episodes of incontinence when she could not reach the bathroom in time. She also endorses diffuse, achy, moderately severe abdominal pain; subjective fever at home. She has experienced some general weakness over  the course of her illness. She has been spending most of her time in bed recovering. She denies headache, visual changes, congestion, rhinorrhea, sore throat, chest pain, shortness of breath, dysuria, frequency, muscle pain, joint pain.  Of note, her husband fell ill with the same illness 2 days after her symptoms began. He is also being evaluated in the ED tonight for nausea, vomiting, diarrhea. They deny recent travel or cruises. No recent antibiotic use. No history of drinking from streams or rivers. They do not have well water. There have been no major changes to their diet. Shelby Mendez cooks most of the meals in their home. The night before she fell ill, they went out to eat at [employer] with their son and his family. However, they all ordered similar meals, and only  Shelby Mendez fell ill soon after.    Hospital Course by problem list: Principal Problem:   Diarrhea with dehydration Active Problems:   Hypokalemia     Diarrhea with dehydration - Patient presented with acute onset of diarrhea and vomiting of 5 day duration. No leukocytosis with normal lipase, UA, and complete metabolic panel. C diff PCR was negative.  CT of abdomen with contrast was unremarkable. EKG was without ischemic changes. Patient was given IV fluids to correct dehydration with diet slowly advanced as tolerated. Nausea and pain were controlled with medical therapy. Etiology of illness thought to be viral gastroenteritis. Patient's symptoms improved during hospitalization and was hemodynamically stable at time of discharge.  Right lower extremity DVT, unprovoked - Patient was complaining of right leg warmth, erythema, and tenderness on second day of admission. Due to concern for DVT, bilateral Doppler Ultrasound was performed revealing acute deep vein thrombosis involving the right popliteal vein and partial DVT or fibrous strand noted in the right common femoral vein. Patient was on aspirin therapy at home. She has had no prior history of DVT or PE. Patient was started on lovenox during hospitalization and 1 dose of xarelto on day of discharge. Patient was instructed to stop taking aspirin and take Xarelto 15mg  BID for 21 days then Xarelto 20mg  daily for duration of at least 3 months. Patient will follow-up with PCP and depending on her progress will determine if 3 or 6 month AC therapy is indicated. She was given prescription with no refill for 90 day supply and understands the risk of bleeding with taking Xarelto. She stated she will be able to afford Xarelto and was given 30 day free trial card to activate on her own. Further work-up should include etiology of 1st unprovoked DVT. Due to patient's advanced age, genetic cause of thrombophilia probably unlikely as this is her 1st DVT at age 70.  Acquired thrombophilia most likely. Patient should have appropriate routine cancer screening as DVT can be first sign of malignancy.    Hypokalemia - Patient with diarrhea and vomiting found to have potassium on admission of  2.6 that was corrected with IV and oral potassium that normalized (3.7) at time of discharge. Patient did not tolerate IV potassium well due to burning at IV site.  Patient also with low magnesium (1.9) that was repleted with oral magnesium oxide.     Hyponatremia - Patient with sodium of 132 most likely due to hypovolemic hyponatremia that resolved during hospitalization with normalization of sodium at time of discharge.    Prerenal azotemia  - Patient with BUN/Cr of 44:1 most likely due to decreased PO intake and GI losses that improved to 23:1 during hospitalization.  Anion gap metabolic acidosis - Patient with anion gap of 19 on admission and low bicarbonate (16) with normal lactic acid most likely due to starvation ketoacidosis considering concomitant ketonuria. Anion gap closed to 9 during hospitalization.      Discharge Vitals:   BP 109/63  Pulse 85  Temp(Src) 97.8 F (36.6 C) (Oral)  Resp 18  Ht 5\' 1"  (1.549 m)  Wt 67.042 kg (147 lb 12.8 oz)  BMI 27.94 kg/m2  SpO2 99%  Discharge Labs:  Results for orders placed during the hospital encounter of 03/27/13 (from the past 24 hour(s))  BASIC METABOLIC PANEL     Status: Abnormal   Collection Time    03/28/13  5:57 PM      Result Value Range   Sodium 138  135 - 145 mEq/L   Potassium 4.0  3.5 - 5.1 mEq/L   Chloride 110  96 - 112 mEq/L   CO2 16 (*) 19 - 32 mEq/L   Glucose, Bld 90  70 - 99 mg/dL   BUN 24 (*) 6 - 23 mg/dL   Creatinine, Ser 1.61  0.50 - 1.10 mg/dL   Calcium 9.0  8.4 - 09.6 mg/dL   GFR calc non Af Amer 84 (*) >90 mL/min   GFR calc Af Amer >90  >90 mL/min  BASIC METABOLIC PANEL     Status: Abnormal   Collection Time    03/29/13  4:55 AM      Result Value Range   Sodium 140  135 - 145 mEq/L     Potassium 3.4 (*) 3.5 - 5.1 mEq/L   Chloride 113 (*) 96 - 112 mEq/L   CO2 19  19 - 32 mEq/L   Glucose, Bld 93  70 - 99 mg/dL   BUN 16  6 - 23 mg/dL   Creatinine, Ser 0.45  0.50 - 1.10 mg/dL   Calcium 8.6  8.4 - 40.9 mg/dL   GFR calc non Af Amer 87 (*) >90 mL/min   GFR calc Af Amer >90  >90 mL/min  MAGNESIUM     Status: None   Collection Time    03/29/13  4:55 AM      Result Value Range   Magnesium 1.9  1.5 - 2.5 mg/dL  BASIC METABOLIC PANEL     Status: Abnormal   Collection Time    03/29/13  1:25 PM      Result Value Range   Sodium 139  135 - 145 mEq/L   Potassium 3.7  3.5 - 5.1 mEq/L   Chloride 110  96 - 112 mEq/L   CO2 20  19 - 32 mEq/L   Glucose, Bld 98  70 - 99 mg/dL   BUN 13  6 - 23 mg/dL   Creatinine, Ser 8.11  0.50 - 1.10 mg/dL   Calcium 8.8  8.4 - 91.4 mg/dL   GFR calc non Af Amer 86 (*) >90 mL/min   GFR calc Af Amer >90  >90 mL/min    Signed: Otis Brace, MD 03/29/2013, 4:49 PM   Time Spent on Discharge: 35 minutes Services Ordered on Discharge: none Equipment Ordered on Discharge: none

## 2013-04-06 ENCOUNTER — Ambulatory Visit (INDEPENDENT_AMBULATORY_CARE_PROVIDER_SITE_OTHER): Payer: Medicare Other | Admitting: Internal Medicine

## 2013-04-06 ENCOUNTER — Encounter: Payer: Self-pay | Admitting: Internal Medicine

## 2013-04-06 VITALS — BP 121/72 | HR 77 | Temp 97.0°F | Ht 61.5 in | Wt 148.1 lb

## 2013-04-06 DIAGNOSIS — R197 Diarrhea, unspecified: Secondary | ICD-10-CM

## 2013-04-06 DIAGNOSIS — E876 Hypokalemia: Secondary | ICD-10-CM

## 2013-04-06 DIAGNOSIS — I824Z9 Acute embolism and thrombosis of unspecified deep veins of unspecified distal lower extremity: Secondary | ICD-10-CM | POA: Insufficient documentation

## 2013-04-06 DIAGNOSIS — I824Z1 Acute embolism and thrombosis of unspecified deep veins of right distal lower extremity: Secondary | ICD-10-CM

## 2013-04-06 LAB — BASIC METABOLIC PANEL
CO2: 29 mEq/L (ref 19–32)
Calcium: 9.5 mg/dL (ref 8.4–10.5)
Creat: 0.75 mg/dL (ref 0.50–1.10)
Glucose, Bld: 94 mg/dL (ref 70–99)
Sodium: 139 mEq/L (ref 135–145)

## 2013-04-06 NOTE — Patient Instructions (Signed)
1. Please follow up with your PCP for further issues. We will contact you if your blood tests show any abnormalities. 2. Please take all medications as prescribed. Continue taking the Xarelto 3. If you have worsening of your symptoms or new symptoms arise, please call the clinic [phone]), or go to the ER immediately if symptoms are severe.  You have done great job in taking all your medications. I appreciate it very much. Please continue doing that.  Deep Vein Thrombosis A deep vein thrombosis (DVT) is a blood clot that develops in a deep vein. A DVT is a clot in the deep, larger veins of the leg, arm, or pelvis. These are more dangerous than clots that might form in veins near the surface of the body. A DVT can lead to complications if the clot breaks off and travels in the bloodstream to the lungs.  A DVT can damage the valves in your leg veins, so that instead of flowing upwards, the blood pools in the lower leg. This is called post-thrombotic syndrome, and can result in pain, swelling, discoloration, and sores on the leg. Once identified, a DVT can be treated. It can also be prevented in some circumstances. Once you have had a DVT, you may be at increased risk for a DVT in the future. CAUSES Blood clots form in a vein for different reasons. Usually several things contribute to blood clots. Contributing factors include:  The flow of blood slows down.  The inside of the vein is damaged in some way.  The person has a condition that makes blood clot more easily. Some people are more likely than others to develop blood clots. That is because they have more factors that make clots likely. These are called risk factors. Risk factors include:   Older age, especially over 68 years old.  Having a history of blood clots. This means you have had one before. Or, it means that someone else in your family has had blood clots. You may have a genetic tendency to form clots.  Having major or lengthy  surgery. This is especially true for surgery on the hip, knee, or belly (abdomen). Hip surgery is particularly high risk.  Breaking a hip or leg.  Sitting or lying still for a long time. This includes long distance travel, paralysis, or recovery from an illness or surgery.  Cancer, or cancer treatment.  Having a long, thin tube (catheter) placed inside a vein during a medical procedure.  Being overweight (obese).  Pregnancy and childbirth. Hormone changes make the blood clot more easily during pregnancy. The fetus puts pressure on the veins of the pelvis. There is also risk of injury to veins during delivery or a caesarean. The risk is at its highest just after childbirth.  Medicines with the female hormone estrogen. This includes birth control pills and hormone replacement therapy.  Smoking.  Other circulation or heart problems. SYMPTOMS When a clot forms, it can either partially or totally block the blood flow in that vein. Symptoms of a DVT can include:  Swelling of the leg or arm, especially if one side is much worse.  Warmth and redness of the leg or arm, especially if one side is much worse.  Pain in an arm or leg. If the clot is in the leg, symptoms may be more noticeable or worse when standing or walking. The symptoms of a DVT that has traveled to the lungs (pulmonary embolism, PE) usually start suddenly, and include:  Shortness of breath.  Coughing.  Coughing up blood or blood-tinged phlegm.  Chest pain. The chest pain is often worse with deep breaths.  Rapid heartbeat. Anyone with these symptoms should get emergency medical treatment right away. Call your local emergency services (911 in [place] if you have these symptoms. DIAGNOSIS If a DVT is suspected, your caregiver will take a full medical history and carry out a physical exam. Tests that also may be required include:  Blood tests, including studies of the clotting properties of the blood.  Ultrasonography to  see if you have clots in your legs or lungs.  X-rays to show the flow of blood when dye is injected into the veins (venography).  Studies of your lungs, if you have any chest symptoms. PREVENTION  Exercise the legs regularly. Take a brisk 30 minute walk every day.  Maintain a weight that is appropriate for your height.  Avoid sitting or lying in bed for long periods of time without moving your legs.  Women, particularly those over the age of 27, should consider the risks and benefits of taking estrogen medicines, including birth control pills.  Do not smoke, especially if you take estrogen medicines.  Long distance travel can increase your risk of DVT. You should exercise your legs by walking or pumping the muscles every hour.  In-hospital prevention:  Many of the risk factors above relate to situations that exist with hospitalization, either for illness, injury, or elective surgery.  Your caregiver will assess you for the need for venous thromboembolism prophylaxis when you are admitted to the hospital. If you are having surgery, your surgeon will assess you the day of or day after surgery.  Prevention may include medical and nonmedical measures. TREATMENT Treatment for DVT helps prevent death and disability. The most common treatment for DVT is blood thinning (anticoagulant) medicine, which reduces the blood's tendency to clot. Anticoagulants can stop new blood clots from forming and old ones from growing. They cannot dissolve existing clots. Your body does this by itself over time. Anticoagulants can be given by mouth, by intravenous (IV) access, or by injection. Your caregiver will determine the best program for you.  Heparin or related medicines (low molecular weight heparin) are usually the first treatment for a blood clot. They act quickly. However, they cannot be taken orally.  Heparin can cause a fall in a component of blood that stops bleeding and forms blood clots  (platelets). You will be monitored with blood tests to be sure this does not occur.  Warfarin is an anticoagulant that can be swallowed (taken orally). It takes a few days to start working, so usually heparin or related medicines are used in combination. Once warfarin is working, heparin is usually stopped.  Less commonly, clot dissolving drugs (thrombolytics) are used to dissolve a DVT. They carry a high risk of bleeding, so they are used mainly in severe cases, where a life or limb is threatened.  Very rarely, a blood clot in the leg needs to be removed surgically.  If you are unable to take anticoagulants, your caregiver may arrange for you to have a filter placed in a main vein in your belly (abdomen). This filter prevents clots from traveling to your lungs. HOME CARE INSTRUCTIONS  Take all medicines prescribed by your caregiver. Follow the directions carefully.  Warfarin. Most people will continue taking warfarin after hospital discharge. Your caregiver will advise you on the length of treatment (usually 3 6 months, sometimes lifelong).  Too much and too little warfarin are  both dangerous. Too much warfarin increases the risk of bleeding. Too little warfarin continues to allow the risk for blood clots. While taking warfarin, you will need to have regular blood tests to measure your blood clotting time. These blood tests usually include both the prothrombin time (PT) and international normalized ratio (INR) tests. The PT and INR results allow your caregiver to adjust your dose of warfarin. The dose can change for many reasons. It is critically important that you take warfarin exactly as prescribed, and that you have your PT and INR levels drawn exactly as directed.  Many foods, especially foods high in vitamin K can interfere with warfarin and affect the PT and INR results. Foods high in vitamin K include spinach, kale, broccoli, cabbage, collard and turnip greens, brussels sprouts, peas,  cauliflower, seaweed, and parsley as well as beef and pork liver, green tea, and soybean oil. You should eat a consistent amount of foods high in vitamin K. Avoid major changes in your diet, or notify your caregiver before changing your diet. Arrange a visit with a dietitian to answer your questions.  Many medicines can interfere with warfarin and affect the PT and INR results. You must tell your caregiver about any and all medicines you take, this includes all vitamins and supplements. Be especially cautious with aspirin and anti-inflammatory medicines. Ask your caregiver before taking these. Do not take or discontinue any prescribed or over-the-counter medicine except on the advice of your caregiver or pharmacist.  Warfarin can have side effects, primarily excessive bruising or bleeding. You will need to hold pressure over cuts for longer than usual. Your caregiver or pharmacist will discuss other potential side effects.  Alcohol can change the body's ability to handle warfarin. It is best to avoid alcoholic drinks or consume only very small amounts while taking warfarin. Notify your caregiver if you change your alcohol intake.  Notify your dentist or other caregivers before procedures.  Activity. Ask your caregiver how soon you can go back to normal activities. It is important to stay active to prevent blood clots. If you are on anticoagulant medicine, avoid contact sports.  Exercise. It is very important to exercise. This is especially important while traveling, sitting or standing for long periods of time. Exercise your legs by walking or by pumping the muscles frequently. Take frequent walks.  Compression stockings. These are tight elastic stockings that apply pressure to the lower legs. This pressure can help keep the blood in the legs from clotting. You may need to wear compressions stockings at home to help prevent a DVT.  Smoking. If you smoke, quit. Ask your caregiver for help with  quitting smoking.  Learn as much as you can about DVT. Knowing more about the condition should help you keep it from coming back.  Wear a medical alert bracelet or carry a medical alert card. SEEK MEDICAL CARE IF:  You notice a rapid heartbeat.  You feel weaker or more tired than usual.  You feel faint.  You notice increased bruising.  You feel your symptoms are not getting better in the time expected.  You believe you are having side effects of medicine. SEEK IMMEDIATE MEDICAL CARE IF:  You have chest pain.  You have trouble breathing.  You have new or increased swelling or pain in one leg.  You cough up blood.  You notice blood in vomit, in a bowel movement, or in urine. MAKE SURE YOU:  Understand these instructions.  Will watch your condition.  Will  get help right away if you are not doing well or get worse. Document Released: 07/21/2005 Document Revised: 04/14/2012 Document Reviewed: 09/12/2010 [facility] Patient Information 2014 [place], [place].

## 2013-04-06 NOTE — Assessment & Plan Note (Signed)
Patient developed hypokalemia during her admission.  -Checked BMET and magnesium during her hospital follow-up appointment. Results pending.

## 2013-04-06 NOTE — Assessment & Plan Note (Signed)
Patient denies any further issues w/ her RLE. Denies any pain, swelling, erythema, or increase in temperature. Has been compliant w/ her Xarelto since her discharge on 03/29/13. Physical exam reveals no asymmetry when compared to her LLE. No erythema, tenderness, or increase in warmth. No edema or swelling noted. -Continue Xarelto 15 mg po bid for 2-3 weeks. Will start taking 20 mg qd w/ dinner for a total of 6 months. -Will follow up w/ her PCP regarding this issue.

## 2013-04-06 NOTE — Progress Notes (Signed)
Subjective:   Patient ID: Shelby Mendez female   DOB: 1931-08-28 79 y.o.   MRN: [id number]  HPI: [name] is a 79 y.o. F w/ PMHx of Sarcoidosis and LE superficial phlebitis, presents to the clinic today for a hospital follow up. The patient was admitted on 03/27/13 for nausea/vomiting, and diarrhea 2/2 a viral gastroenteritis. She was treated for dehydration in the hospital. During her admission, she developed R LE swelling, LE doppler showed popliteal DVT. She was started on Xarelto and discharged from the hospital.  Today she reports no further issues. She denies any nausea, vomiting, diarrhea, dizziness, lightheadedness, palpitations, or LOC. She also says her appetite has more or less returned to normal and she has had adequate fluid intake since her discharge.  She also denies any further pain, swelling, or redness in her R LE. She has been very compliant with her Xarelto and has had no issues with the medication.  Patient is scheduled to follow up with her PCP today.   Past Medical History  Diagnosis Date  . Duodenal adenoma   . Hernia   . Anemia   . Intestinal polyp   . Cancer     skin- basal cell   Current Outpatient Prescriptions  Medication Sig Dispense Refill  . DiphenhydrAMINE HCl (ALLERGY MEDICATION PO) Take 1 tablet by mouth daily.      Chilton Si Tea, Camillia sinensis, (GREEN TEA PO) Take 650 mg by mouth daily.      . Multiple Vitamins-Minerals (OCUVITE ADULT 50+ PO) Take 1 tablet by mouth daily.      . Multiple Vitamins-Minerals (WOMENS 50+ MULTI VITAMIN/MIN PO) Take 1 tablet by mouth daily.      . Nutritional Supplements (GRAPESEED EXTRACT) 500-50 MG CAPS Take 1 tablet by mouth daily.      . Rivaroxaban (XARELTO) 15 MG TABS tablet Take 1 tablet (15 mg total) by mouth 2 (two) times daily with a meal.  42 tablet  0  . [START ON 04/20/2013] Rivaroxaban (XARELTO) 20 MG TABS tablet Take 1 tablet (20 mg total) by mouth daily with supper.  90 tablet  0  . thiamine  (VITAMIN B-1) 100 MG tablet Take 100 mg by mouth daily.      Marland Kitchen VITAMIN E PO Take 800 Units by mouth daily.        No current facility-administered medications for this visit.   Family History  Problem Relation Age of Onset  . Cancer Mother    History   Social History  . Marital Status: Married    Spouse Name: N/A    Number of Children: N/A  . Years of Education: N/A   Social History Main Topics  . Smoking status: Never Smoker   . Smokeless tobacco: None  . Alcohol Use: Yes     Comment: occasionally  . Drug Use: No  . Sexual Activity: None   Other Topics Concern  . None   Social History Narrative  . None   Review of Systems: General: Denies fever, chills, diaphoresis, appetite change and fatigue.  HEENT: Denies change in vision, eye pain, redness, hearing loss, congestion, sore throat, rhinorrhea, sneezing, mouth sores, trouble swallowing, neck pain, neck stiffness and tinnitus.   Respiratory: Denies SOB, DOE, cough, chest tightness, and wheezing.   Cardiovascular: Denies chest pain, palpitations and leg swelling.  Gastrointestinal: Denies nausea, vomiting, abdominal pain, diarrhea, constipation, blood in stool and abdominal distention.  Genitourinary: Denies dysuria, urgency, frequency, hematuria, flank pain and difficulty urinating.  Endocrine: Denies hot or cold intolerance, sweats, polyuria, polydipsia. Musculoskeletal: Denies myalgias, back pain, joint swelling, arthralgias and gait problem.  Skin: Denies pallor, rash and wounds.  Neurological: Denies dizziness, seizures, syncope, weakness, lightheadedness, numbness and headaches.  Hematological: Denies adenopathy,easy bruising, personal or family bleeding history.  Psychiatric/Behavioral: Denies mood changes, confusion, nervousness, sleep disturbance and agitation.   Objective:  Physical Exam: Filed Vitals:   04/06/13 1000  BP: 121/72  Pulse: 77  Temp: 97 F (36.1 C)  TempSrc: Oral  Height: 5' 1.5" (1.562  m)  Weight: 148 lb 1.6 oz (67.178 kg)  SpO2: 97%   General: Vital signs reviewed.  Patient is a well-developed and well-nourished, in no acute distress and cooperative with exam. Alert and oriented x3.  Head: Normocephalic and atraumatic. Nose: No erythema or drainage noted.  Turbinates normal. Mouth: No erythema, exudates, sores, or ulcerations. Moist mucus membranes. Eyes: PERRL, EOMI, conjunctivae normal, No scleral icterus.  Neck: Supple, trachea midline, normal ROM, No JVD, masses, thyromegaly, or carotid bruit present.  Cardiovascular: RRR, S1 normal, S2 normal, no murmurs, gallops, or rubs. Pulmonary/Chest: normal respiratory effort, CTAB, no wheezes, rales, or rhonchi. Abdominal: Soft. Non-tender, non-distended, bowel sounds are normal, no masses, organomegaly, or guarding present.  Musculoskeletal: No joint deformities, erythema, or stiffness, ROM full and no nontender. Extremities: No swelling or edema,  pulses symmetric and intact bilaterally. No cyanosis or clubbing. Neurological: A&O x3, Strength is normal and symmetric bilaterally, cranial nerve II-XII are grossly intact, no focal motor deficit, sensory intact to light touch bilaterally.  Skin: Warm, dry and intact. No rashes or erythema. Psychiatric: Normal mood and affect. speech and behavior is normal. Cognition and memory are normal.   Assessment & Plan:   Please see problem-based assessment and plan.

## 2013-04-06 NOTE — Assessment & Plan Note (Signed)
Diarrhea, nausea, and vomiting have resolved since her discharge on 03/29/13. She claims her appetite has returned and she is eating and drinking well. Claims to be back at her baseline.

## 2013-04-12 NOTE — Discharge Summary (Signed)
I saw Shelby Mendez on day of discharge and agree with formulated plan.

## 2013-04-13 IMAGING — CR DG CHEST 2V
2 series · 2 of 2 positions shown · non-contrast
Comparison: 04/12/2010.

CLINICAL DATA: Preop.  Interstitial lung disease.

CHEST - 2 VIEW

[view not recorded (1 of 2)]
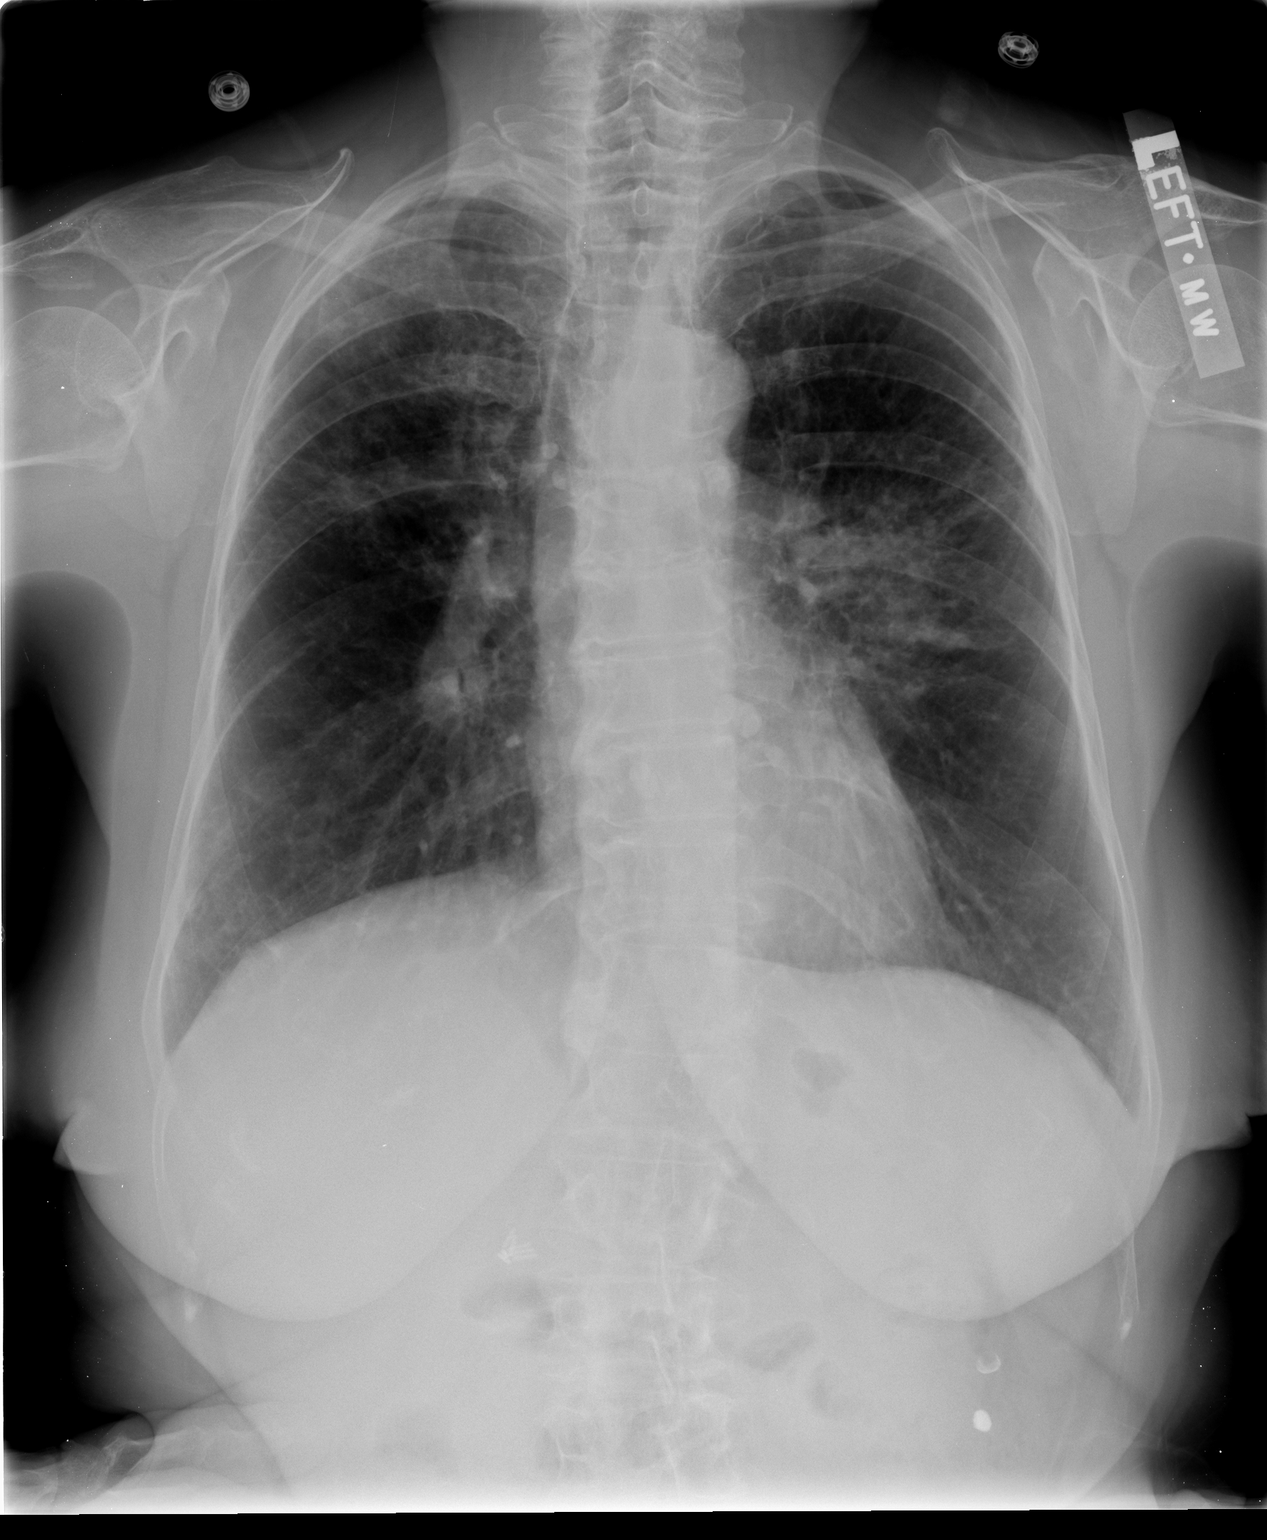

[view not recorded (2 of 2)]
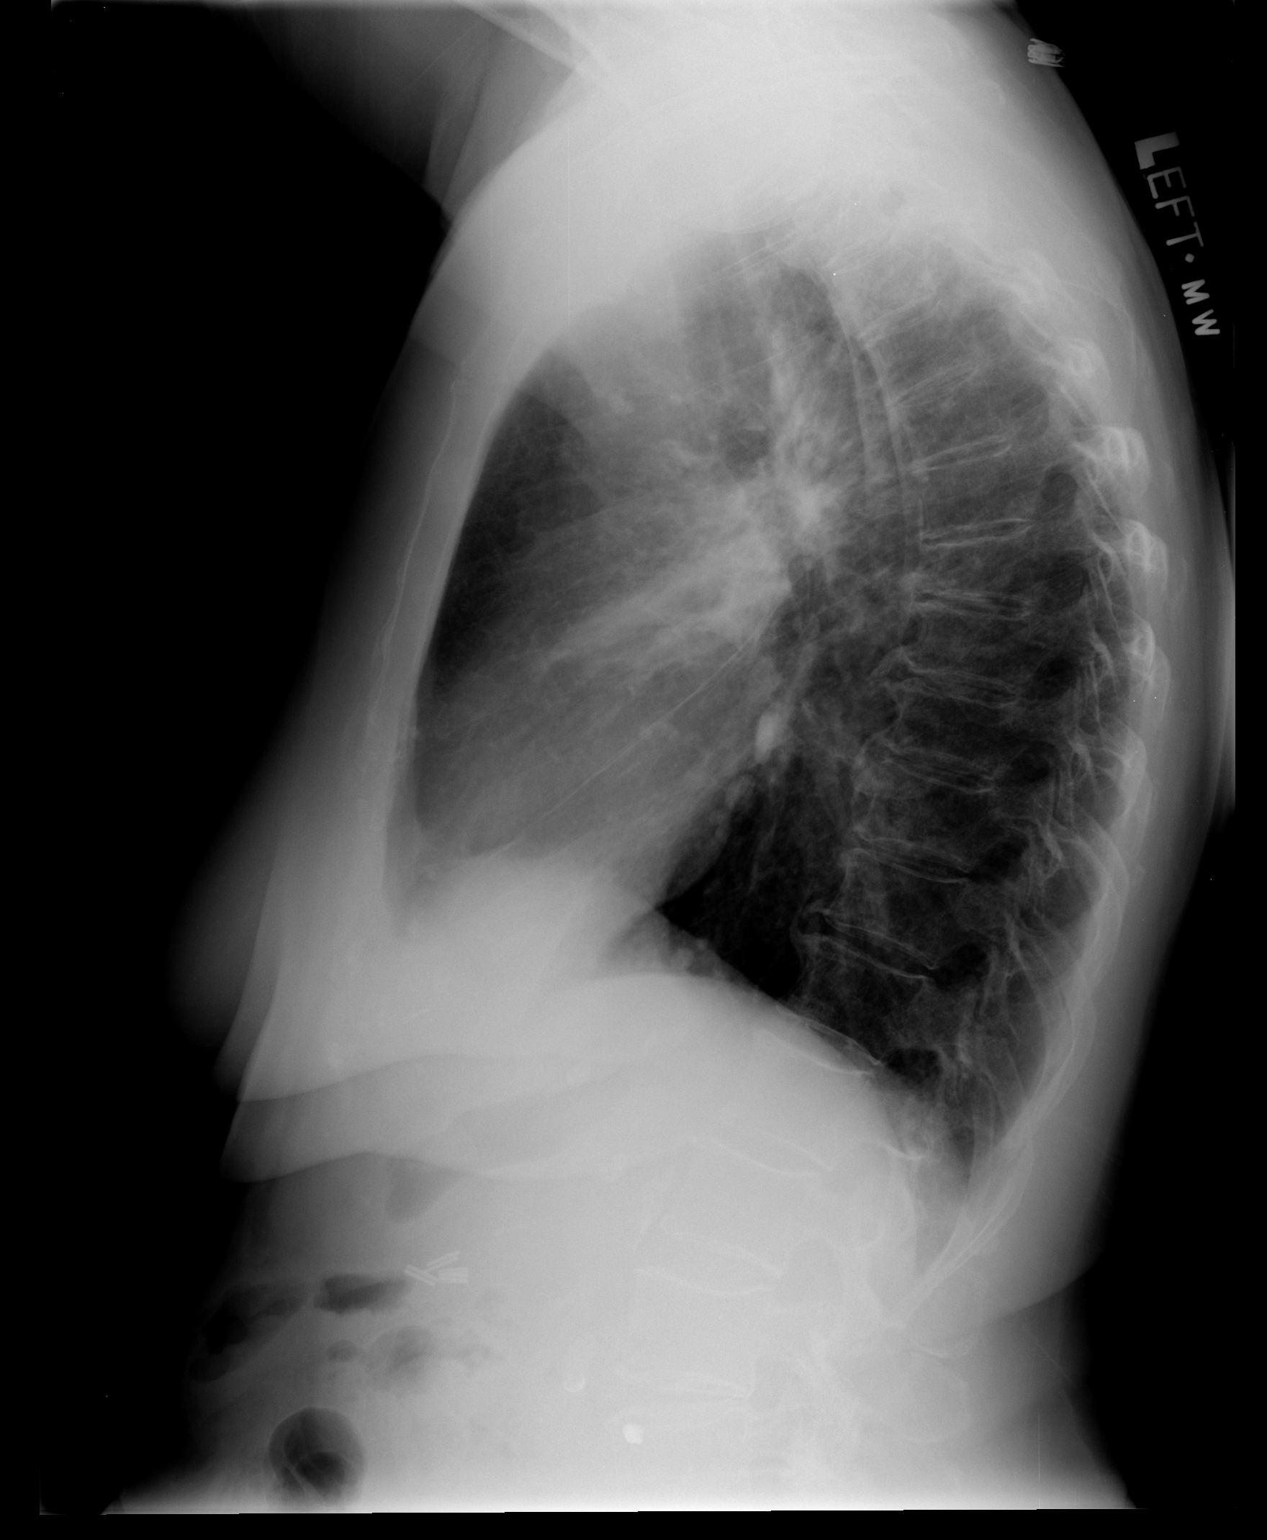

[2 of 2 positions shown; findings below may reference images not displayed]

FINDINGS: Trachea is midline.  Heart size normal.  There are
calcified mediastinal and hilar lymph nodes.  Mild bihilar
prominence.  Confluent soft tissue densities and mild nodularity
are seen primarily in the right upper lobe and left perihilar
region.  Mild involvement at the lung bases.  Pattern and
distribution appear unchanged from 04/12/2010.  No pleural fluid.
Degenerative changes are seen in the spine.
IMPRESSION: Mediastinal, hilar and pulmonary parenchymal changes of sarcoid,
without interval change from 04/12/2010.

## 2013-04-13 NOTE — Progress Notes (Signed)
I saw and evaluated the patient.  I personally confirmed the key portions of the history and exam documented by Dr. Jones and I reviewed pertinent patient test results.  The assessment, diagnosis, and plan were formulated together and I agree with the documentation in the resident's note.   

## 2013-04-18 ENCOUNTER — Other Ambulatory Visit: Payer: Self-pay | Admitting: *Deleted

## 2013-04-18 NOTE — Telephone Encounter (Signed)
I can't tell if pt was intending to est care here bc she also had F/U appt with a NP., Would you pls call pt to see if she has a PCP? Thanks

## 2013-04-18 NOTE — Telephone Encounter (Signed)
Thanks

## 2013-04-19 MED ORDER — RIVAROXABAN 15 MG PO TABS: 15.0000 mg | ORAL_TABLET | Freq: Two times a day (BID) | ORAL | Status: DC

## 2013-04-19 NOTE — Telephone Encounter (Signed)
Note sent to front desk pool for appt as directed.

## 2013-08-15 ENCOUNTER — Ambulatory Visit (INDEPENDENT_AMBULATORY_CARE_PROVIDER_SITE_OTHER): Payer: Medicare Other | Admitting: Internal Medicine

## 2013-08-15 ENCOUNTER — Encounter: Payer: Self-pay | Admitting: Internal Medicine

## 2013-08-15 VITALS — BP 118/60 | HR 70 | Temp 98.0°F | Ht 61.5 in | Wt 155.0 lb

## 2013-08-15 DIAGNOSIS — D869 Sarcoidosis, unspecified: Secondary | ICD-10-CM

## 2013-08-15 NOTE — Progress Notes (Signed)
   Subjective:    Patient ID: Shelby Mendez, female    DOB: 1932/02/23   MRN: [id number]  HPI  38 yowf never smoker from [place] dx with sarcoidosis   2001  ? By liver bx at time of cholecystectomy eval by Pulmonary doc with no need for rx at that point and referred by Dr Eldridge Abrahams 08/15/2013 to pulmonary clinic for scarring on cxr.    08/15/2013 1st [facility] office visit/ Eero Dini cc mostly seasonal nasal congestion x decades no worse since dx of sarcoidosis, never treated with steroids but controlled with otcs prn  Not limited by doe and very active but no aerobics  There is a report of "wheezing" in the referral not but pt denies being aware of this or any lower resp complaint or need for saba.  No obvious day to day or daytime variabilty or assoc chronic cough or cp or chest tightness, subjective wheeze overt sinus or hb symptoms. No unusual exp hx or h/o childhood pna/ asthma or knowledge of premature birth.  Sleeping ok without nocturnal  or early am exacerbation  of respiratory  c/o's or need for noct saba. Also denies any obvious fluctuation of symptoms with weather or environmental changes or other aggravating or alleviating factors except as outlined above   Current Medications, Allergies, Complete Past Medical History, Past Surgical History, Family History, and Social History were reviewed in [employer] record.           Review of Systems  Constitutional: Negative for fever and unexpected weight change.  HENT: Positive for sinus pressure and sneezing. Negative for congestion, dental problem, ear pain, nosebleeds, postnasal drip, rhinorrhea, sore throat and trouble swallowing.   Eyes: Negative for redness and itching.  Respiratory: Negative for cough, chest tightness, shortness of breath and wheezing.   Cardiovascular: Negative for palpitations and leg swelling.  Gastrointestinal: Negative for nausea and vomiting.  Genitourinary: Negative for dysuria.   Musculoskeletal: Negative for joint swelling.  Skin: Negative for rash.  Neurological: Negative for headaches.  Hematological: Does not bruise/bleed easily.  Psychiatric/Behavioral: Negative for dysphoric mood. The patient is not nervous/anxious.        Objective:   Physical Exam  amb wf nad Wt Readings from Last 3 Encounters:  08/15/13 155 lb (70.308 kg)  04/06/13 148 lb 1.6 oz (67.178 kg)  03/27/13 147 lb 12.8 oz (67.042 kg)      HEENT: nl dentition, turbinates, and orophanx. Nl external ear canals without cough reflex   NECK :  without JVD/Nodes/TM/ nl carotid upstrokes bilaterally   LUNGS: no acc muscle use, clear to A and P bilaterally without cough on insp or exp maneuvers   CV:  RRR  no s3 or murmur or increase in P2, no edema   ABD:  soft and nontender with nl excursion in the supine position. No bruits or organomegaly, bowel sounds nl  MS:  warm without deformities, calf tenderness, cyanosis or clubbing  SKIN: warm and dry without lesions    NEURO:  alert, approp, no deficits      07/07/13 cxr Stable parenchymal and med findings c/w sarcoidosis       Assessment & Plan:

## 2013-08-15 NOTE — Patient Instructions (Signed)
For unexplained short of breath or a cough that lasts more than 3 weeks we need to see you back here.

## 2013-08-16 NOTE — Assessment & Plan Note (Addendum)
A good rule of thumb for sarcoid pts is that, since many cases are asymptomatic, if a pt doesn't need prednisone for symptom control at presentation he/ she won't likely ever need it - this is the most likely case here as well.   Reviewed with pt Sarcoidosis is a benign inflammatory condition caused by  The  immune system being too revved up like a thermostat on your furnace that's partially  stuck causing arthitis, rash, short of breath and cough and vision issues.  It typically burns itself out in 75% of patients by the end of 3 years with little to indicate that we really change the natural course of the disease by aggressive treatments intended to alter it.  Treatment is generally reserved for patients with major symptoms we can attribute to sarcoid or if a vital organ (like the eye or kidney or nervous system) becomes affected, which is not her situation at all.   Pulmonary f/u can therefore be prn

## 2015-03-27 ENCOUNTER — Other Ambulatory Visit: Payer: Self-pay | Admitting: *Deleted

## 2015-03-27 DIAGNOSIS — Z86718 Personal history of other venous thrombosis and embolism: Secondary | ICD-10-CM

## 2015-03-27 DIAGNOSIS — I83892 Varicose veins of left lower extremities with other complications: Secondary | ICD-10-CM

## 2015-05-03 ENCOUNTER — Encounter: Payer: Self-pay | Admitting: Surgery

## 2015-05-07 ENCOUNTER — Ambulatory Visit (HOSPITAL_COMMUNITY)
Admission: RE | Admit: 2015-05-07 | Discharge: 2015-05-07 | Disposition: A | Discharge status: Home / Self Care | Payer: Medicare Other | Source: Ambulatory Visit | Attending: Surgery | Admitting: Surgery

## 2015-05-07 ENCOUNTER — Ambulatory Visit (INDEPENDENT_AMBULATORY_CARE_PROVIDER_SITE_OTHER): Payer: Medicare Other | Admitting: Surgery

## 2015-05-07 ENCOUNTER — Encounter: Payer: Self-pay | Admitting: Surgery

## 2015-05-07 VITALS — BP 127/79 | HR 77 | Temp 98.0°F | Resp 16 | Ht 61.0 in | Wt 162.0 lb

## 2015-05-07 DIAGNOSIS — I83892 Varicose veins of left lower extremities with other complications: Secondary | ICD-10-CM | POA: Insufficient documentation

## 2015-05-07 DIAGNOSIS — Z86718 Personal history of other venous thrombosis and embolism: Secondary | ICD-10-CM | POA: Insufficient documentation

## 2015-05-07 DIAGNOSIS — I872 Venous insufficiency (chronic) (peripheral): Secondary | ICD-10-CM

## 2015-05-07 NOTE — Progress Notes (Signed)
Referred by:  Berkley Harvey, NP [place], [place] [address]  Reason for referral: Evaluation of left lower leg skin lesions  History of Present Illness  Shelby Mendez is a 81 y.o. (1931/11/15) female who presents with chief complaint: "leg breakouts". The patient reports that these lesions "come and go" and that she has had them for years. She reports that they are itchy and she often scratches them. She also has noted the same lesions on her right leg and on her arms and back. She has a history of DVT (right leg) and superficial thrombophlebitis. She denies any pain to her left leg. She denies any swelling, heaviness or aching of her legs. She denies claudication. She denies any non healing wounds. She does have a family history of varicose veins.   Her past medical history includes duodenal adenoma. She does not have hypertension, diabetes or hyperlipidemia.   Past Medical History  Diagnosis Date  . Duodenal adenoma   . Hernia   . Anemia   . Intestinal polyp   . Cancer ([place])     skin- basal cell  . 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 Tonsillectomy  1957  . Abdominal hysterectomy  1975  . Gallbladder surgery  2000  . Duodenotomy and polyp resection    . Vein surgery  right leg  . Exploratory laparotomy    . Basal cell carcinoma excision    . Hernia repair  04/21/11    Ventral hernia repair     Social History   Social History  . Marital Status: Married    Spouse Name: N/A  . Number of Children: N/A  . Years of Education: N/A   Occupational History  . Not on file.   Social History Main Topics  . Smoking status: Never Smoker   . Smokeless tobacco: Never Used  . Alcohol Use: Yes     Comment: occasionally  . Drug Use: No  . Sexual Activity: Not on file   Other Topics Concern  . Not on file   Social History Narrative    Family History  Problem Relation Age of Onset  .  Heart disease Mother   . Varicose Veins Mother   . Heart attack Mother   . Heart attack Father       Current Outpatient Prescriptions on File Prior to Visit  Medication Sig Dispense Refill  . Multiple Vitamins-Minerals (OCUVITE ADULT 50+ PO) Take 1 tablet by mouth daily.    . Multiple Vitamins-Minerals (WOMENS 50+ MULTI VITAMIN/MIN PO) Take 1 tablet by mouth daily.    Marland Kitchen VITAMIN E PO Take 800 Units by mouth daily.     Nyoka Cowden Tea, Camillia sinensis, (GREEN TEA PO) Take 650 mg by mouth daily.    . Nutritional Supplements (GRAPESEED EXTRACT) 500-50 MG CAPS Take 1 tablet by mouth daily.    Marland Kitchen thiamine (VITAMIN B-1) 100 MG tablet Take 100 mg by mouth daily.    . vitamin C (ASCORBIC ACID) 500 MG tablet Take 500 mg by mouth daily.     No current facility-administered medications on file prior to visit.    Allergies  Allergen Reactions  . Penicillins Swelling  . Sulfonamide Derivatives     "spacy"     REVIEW OF SYSTEMS:  (Positives checked otherwise negative)  CARDIOVASCULAR:  []  chest pain, []  chest pressure, []  palpitations, []  shortness of breath when laying  flat, []  shortness of breath with exertion,  []  pain in feet when walking, []  pain in feet when laying flat, []  history of blood clot in veins (DVT), [x]  history of phlebitis, []  swelling in legs, [x]  varicose veins  PULMONARY:  [x]  productive cough, []  asthma, []  wheezing  NEUROLOGIC:  []  weakness in arms or legs, []  numbness in arms or legs, []  difficulty speaking or slurred speech, []  temporary loss of vision in one eye, []  dizziness  HEMATOLOGIC:  []  bleeding problems, []  problems with blood clotting too easily  MUSCULOSKEL:  []  joint pain, []  joint swelling  GASTROINTEST:  []  vomiting blood, []  blood in stool     GENITOURINARY:  []  burning with urination, []  blood in urine  PSYCHIATRIC:  []  history of major depression  INTEGUMENTARY:  []  rashes, []  ulcers  CONSTITUTIONAL:  []  fever, []  chills   Physical  Examination Filed Vitals:   05/07/15 1302  BP: 127/79  Pulse: 77  Temp: 98 F (36.7 C)  TempSrc: Oral  Resp: 16  Height: 5\' 1"  (1.549 m)  Weight: 162 lb (73.483 kg)  SpO2: 97%   Body mass index is 30.63 kg/(m^2).  General: A&O x 3, WDWN in NAD  Neck: Supple  Pulmonary: Sym exp, good air movt, mild bilateral expiratory wheeze  Cardiac: RRR, Nl S1, S2, no Murmurs, rubs or gallops  Vascular: palpable PT pulses b/l, spider veins bilaterally. No significant varicosities noted.   Musculoskeletal: Extremities without ischemic changes  Neurologic: Pain and light touch intact in extremities   Psychiatric: Judgment intact, Mood & affect appropriate for pt's clinical situation  Dermatologic: erythematous excoriated 1 cm lesions to left anterior shin and bilateral arms.   Non-Invasive Vascular Imaging  BLE Venous Insufficiency Duplex (Date: 05/07/2015):   LLE: negative for DVT. Deep venous reflux present. GSV reflux present. GSV diameters: 0.31cm at knee to 0.68 at [facility]  Outside Studies/Documentation 7 pages of outside documents were reviewed including: PCP  Medical Decision Making  Shelby Mendez is a 81 y.o. female who presents with erythematous pruritic skin lesions to left leg and arms and chronic lower extremity venous insufficiency.   The patient is asymptomatic in regards to her chronic venous insufficiency. Her skin lesions are unrelated to arterial or vascular disease. She has palpable pedal pulses on exam. Recommend dermatology referral for further evaluation. Follow up prn.   Virgina Jock, PA-C [facility] Office: [phone] Pager: [phone]  05/07/2015, 2:22 PM  This patient was seen and examined in conjunction with Dr. Trula Slade  I agree with the above.  I have seen and evaluated the patient.  Her biggest complaint is that of skin lesions to both her right arm and left leg.  These have been present in the past and have  healed.  On exam she has palpable pulses and no significant edema.  Her biggest complaint is that of these skin lesions.  I think that she needs to be referred to dermatology for diagnosis.  She will follow up with [place] on an as-needed basis.  Annamarie Major

## 2016-04-01 ENCOUNTER — Other Ambulatory Visit: Payer: Self-pay

## 2022-01-09 ENCOUNTER — Ambulatory Visit: Payer: Medicare Other | Admitting: Physician Assistant

## 2022-03-04 DEATH — deceased
